# Patient Record
Sex: Male | Born: 1950 | Marital: Married | Smoking: Never smoker
Health system: Southern US, Community
[De-identification: ages and names within clinical notes are randomized; demographics above are authoritative.]

## PROBLEM LIST (undated history)

## (undated) DIAGNOSIS — F32A Depression, unspecified: Secondary | ICD-10-CM

## (undated) DIAGNOSIS — F329 Major depressive disorder, single episode, unspecified: Secondary | ICD-10-CM

## (undated) DIAGNOSIS — I219 Acute myocardial infarction, unspecified: Secondary | ICD-10-CM

## (undated) DIAGNOSIS — I2510 Atherosclerotic heart disease of native coronary artery without angina pectoris: Secondary | ICD-10-CM

## (undated) DIAGNOSIS — I471 Supraventricular tachycardia: Secondary | ICD-10-CM

## (undated) DIAGNOSIS — R001 Bradycardia, unspecified: Secondary | ICD-10-CM

## (undated) DIAGNOSIS — I499 Cardiac arrhythmia, unspecified: Secondary | ICD-10-CM

## (undated) DIAGNOSIS — E785 Hyperlipidemia, unspecified: Secondary | ICD-10-CM

## (undated) DIAGNOSIS — F419 Anxiety disorder, unspecified: Secondary | ICD-10-CM

## (undated) DIAGNOSIS — I2583 Coronary atherosclerosis due to lipid rich plaque: Principal | ICD-10-CM

## (undated) HISTORY — PX: CORONARY ANGIOPLASTY: SHX604

## (undated) HISTORY — PX: OTHER SURGICAL HISTORY: SHX169

## (undated) HISTORY — DX: Bradycardia, unspecified: R00.1

## (undated) HISTORY — DX: Hyperlipidemia, unspecified: E78.5

## (undated) HISTORY — DX: Supraventricular tachycardia: I47.1

## (undated) HISTORY — DX: Coronary atherosclerosis due to lipid rich plaque: I25.83

## (undated) HISTORY — DX: Atherosclerotic heart disease of native coronary artery without angina pectoris: I25.10

---

## 2005-05-05 ENCOUNTER — Encounter (INDEPENDENT_AMBULATORY_CARE_PROVIDER_SITE_OTHER): Payer: Self-pay | Admitting: Specialist

## 2005-05-05 ENCOUNTER — Ambulatory Visit (HOSPITAL_COMMUNITY): Admission: RE | Admit: 2005-05-05 | Discharge: 2005-05-05 | Payer: Self-pay | Admitting: Gastroenterology

## 2007-08-02 ENCOUNTER — Inpatient Hospital Stay (HOSPITAL_COMMUNITY): Admission: EM | Admit: 2007-08-02 | Discharge: 2007-08-05 | Payer: Self-pay | Admitting: Emergency Medicine

## 2007-08-08 ENCOUNTER — Encounter: Admission: RE | Admit: 2007-08-08 | Discharge: 2007-08-08 | Payer: Self-pay | Admitting: Cardiology

## 2008-12-06 IMAGING — CT CT PELVIS W/ CM
2 of 3 series · 17 of 46 positions shown, 19 images · IV contrast ([ID] OMNI 300)
Comparison: None.

CLINICAL DATA: 56 year old male status post cardiac catheterization with pelvic pain and some ecchymoses.   Evaluate for retroperitoneal hematoma or femoral artery pseudoaneurysm.
PELVIS CT WITH CONTRAST:
TECHNIQUE: Multidetector CT imaging of the pelvis was performed following the standard protocol during bolus administration of intravenous contrast.
Contrast: 125 ml Omnipaque 300

[Series 3: routine pelvis · axial · 0.74mm/px · z∈[-202,+3]mm · 14 of 49 slices shown, 16 images]
[im 4/49  soft-tissue]
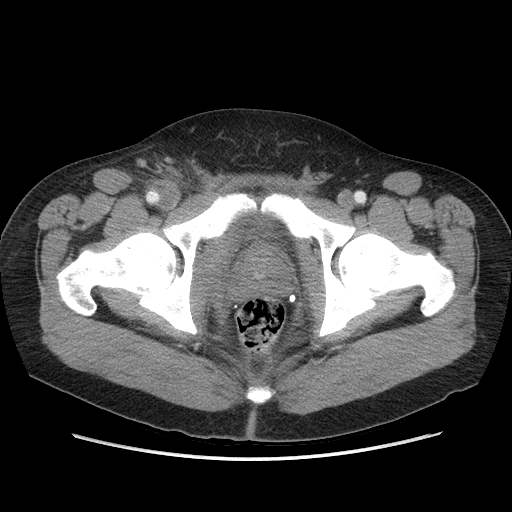
[im 4/49  bone]
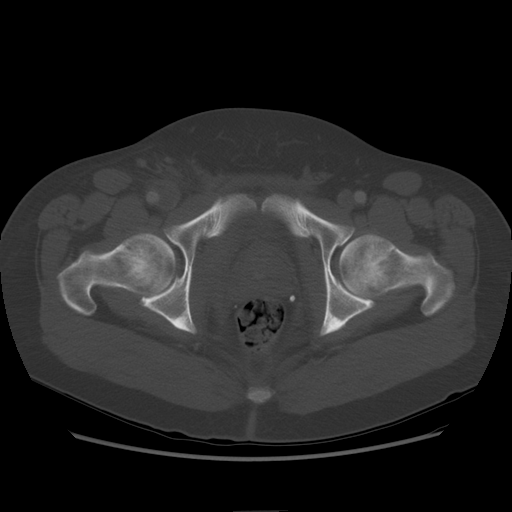
[im 7/49  soft-tissue]
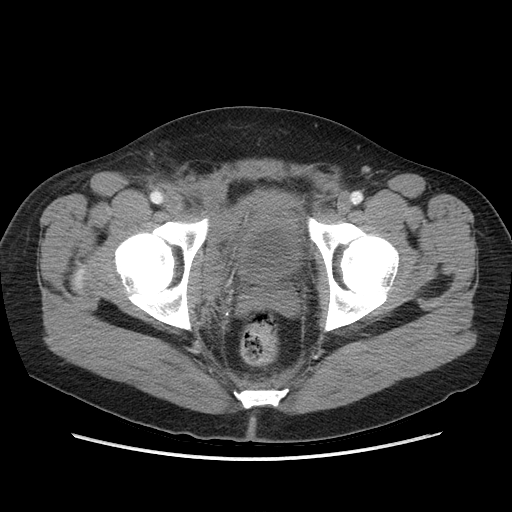
[im 10/49  soft-tissue]
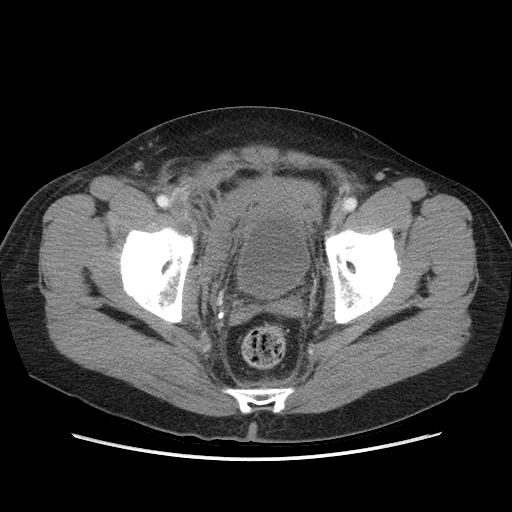
[im 13/49  soft-tissue]
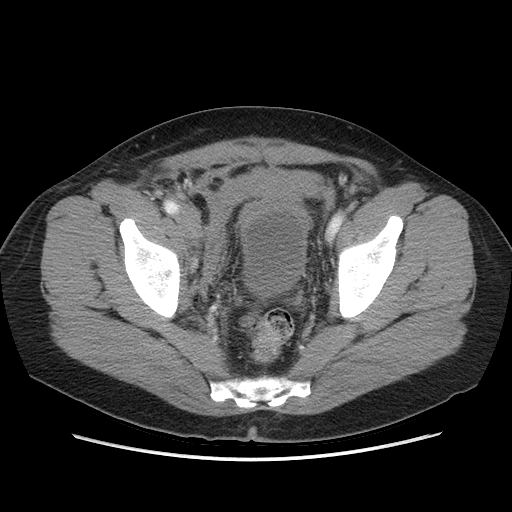
[im 16/49  soft-tissue]
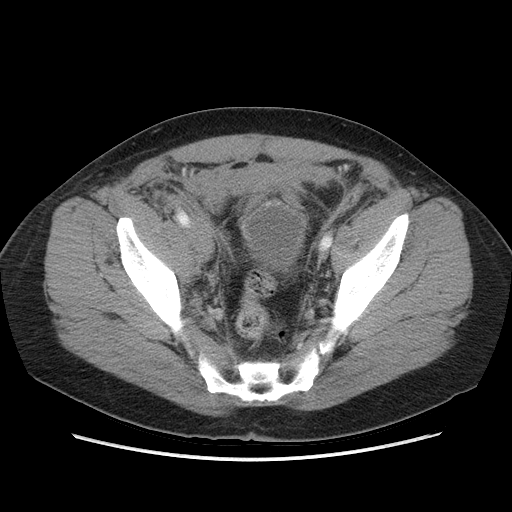
[im 19/49  soft-tissue]
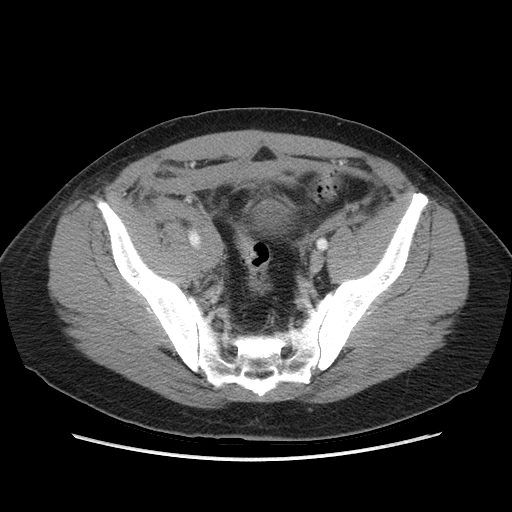
[im 22/49  soft-tissue]
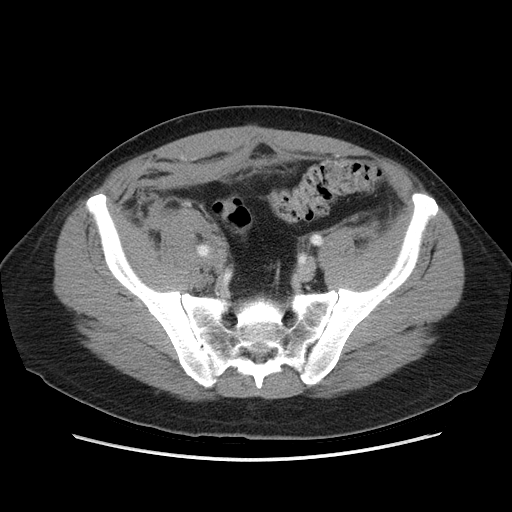
[im 27/49  soft-tissue]
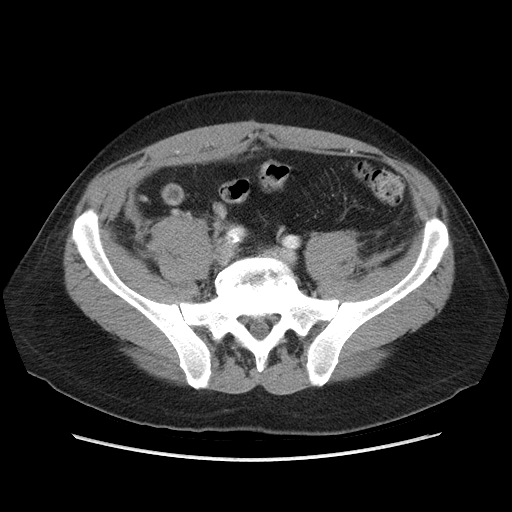
[im 30/49  soft-tissue]
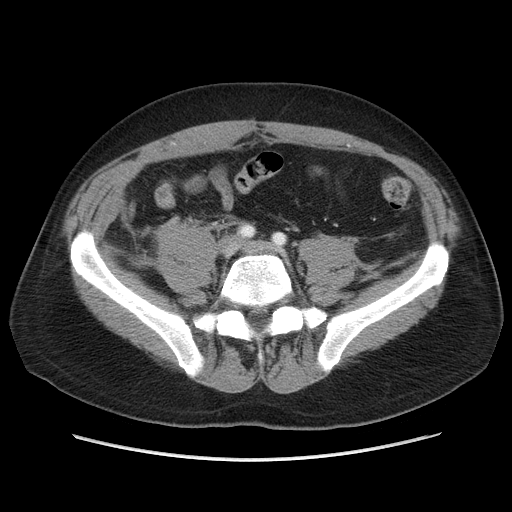
[im 30/49  bone]
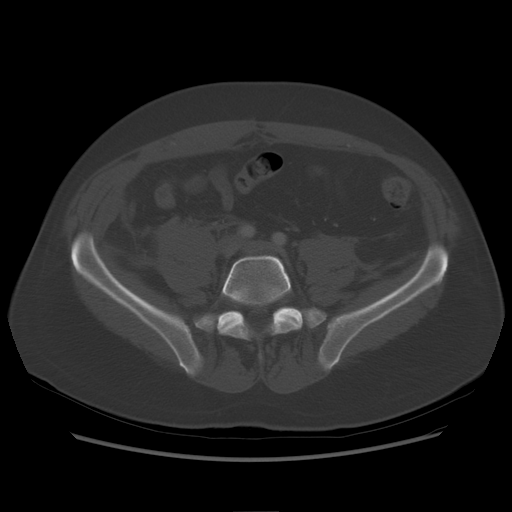
[im 33/49  soft-tissue]
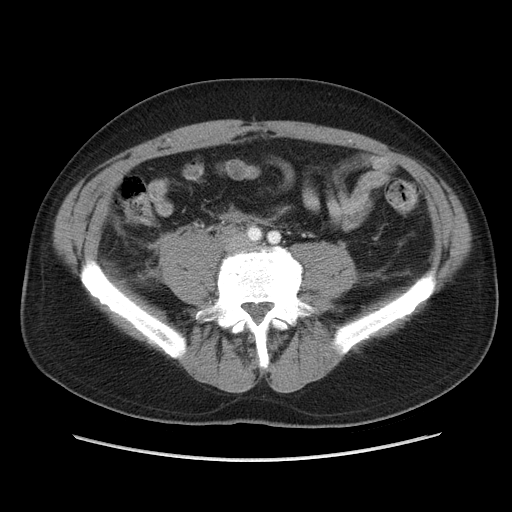
[im 36/49  soft-tissue]
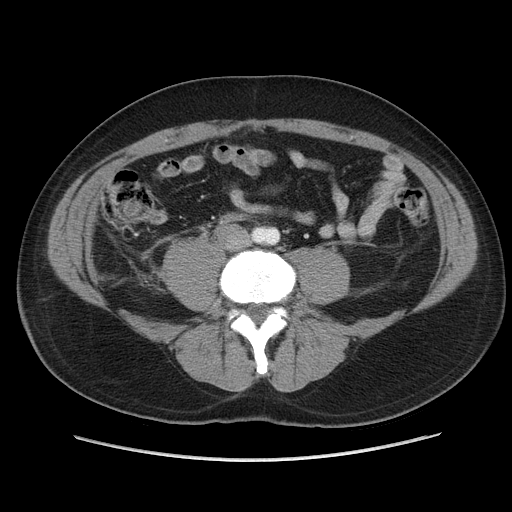
[im 39/49  soft-tissue]
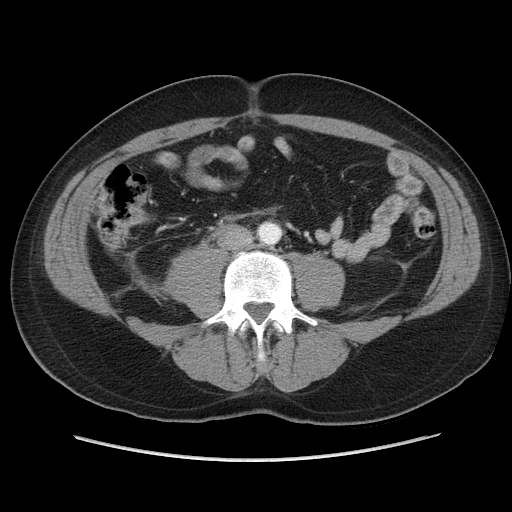
[im 42/49  soft-tissue]
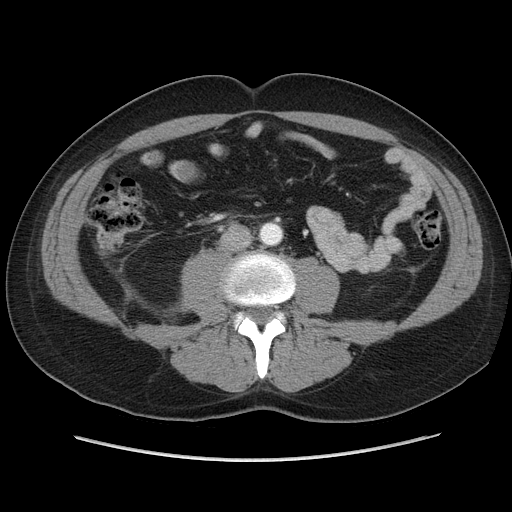
[im 45/49  soft-tissue]
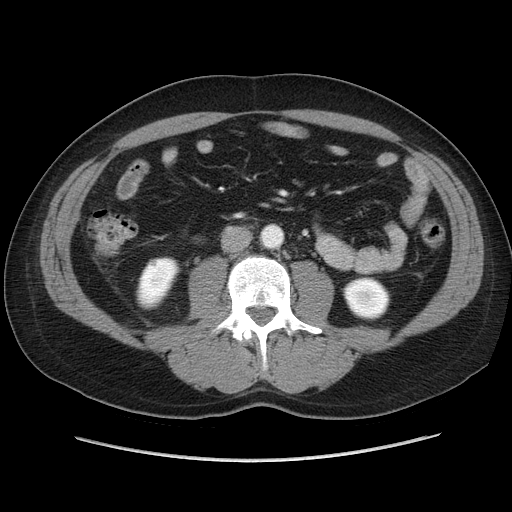

[Series 602: sagittal body · sagittal · 0.74mm/px · 3 of 153 slices shown]
[im 51/153  soft-tissue]
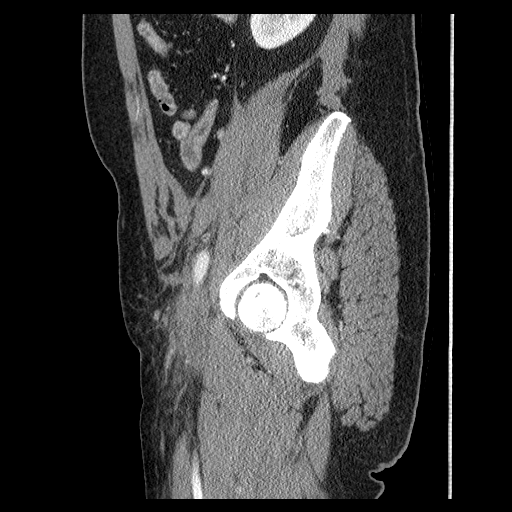
[im 68/153  soft-tissue]
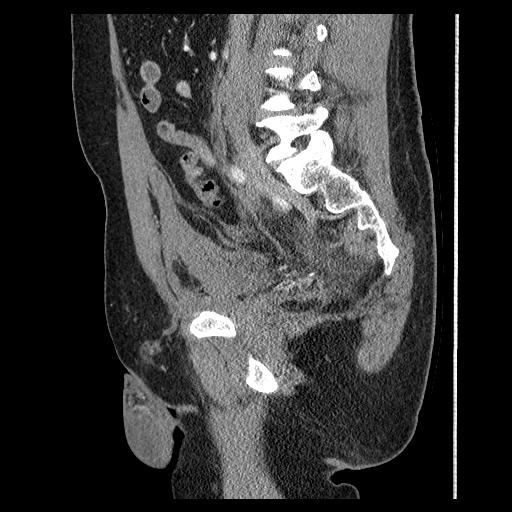
[im 85/153  soft-tissue]
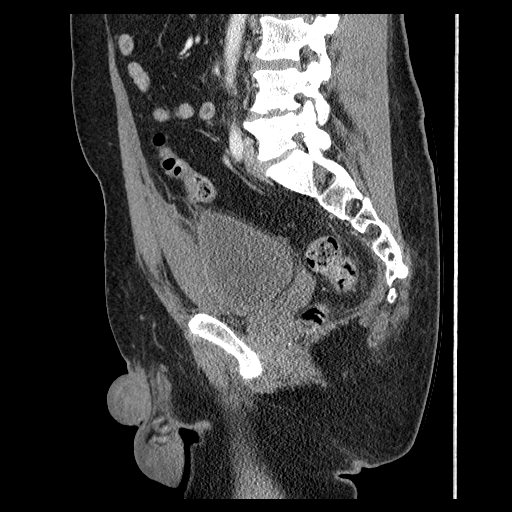

[17 of 46 positions shown; findings below may reference images not displayed]

FINDINGS: No acute osseous abnormality.  Mild disk degeneration in the lower lumbar spine.  The visualized lower poles of the kidneys, large and small bowel loops are within normal limits.  There is no free intraperitoneal fluid identified.  
  There is a moderate amount of extraperitoneal pelvic hematoma and right retroperitoneal hematoma, the latter tracking along the right iliac vessels and right anterior psoas muscle to about the level of the aortoiliac bifurcation.  The extraperitoneal pelvic hemorrhage is right greater than left, but does occupy the midline also with mild associated mass effect on the bladder.  This measures up to 2.6 cm in thickness along the anterior wall of the bladder.  Scanning was continued down into the thighs beyond the level of the superficial and profunda femoral bifurcations.  There is symmetric and normal enhancement of the bilateral iliac vessels and visualized common, superficial, and profunda femoral arteries.  There is mild intermittent atherosclerotic plaque.  At the right common femoral artery, there is no pseudoaneurysm identified and no strong evidence of arteriovenous fistula (no early enhancing veins seen).
IMPRESSION: 1.  Moderate amount of pelvic retroperitoneal and extraperitoneal hematoma as detailed above. 
2.  No evidence of vascular pseudoaneurysm.  No strong evidence of right common femoral arteriovenous fistula.  
The above was discussed with Dr. Susie Weinberg at [DATE] hours on 08/08/07.

## 2010-09-07 ENCOUNTER — Other Ambulatory Visit: Payer: Self-pay | Admitting: Gastroenterology

## 2010-10-20 NOTE — Cardiovascular Report (Signed)
NAME:  Shawn Walton, Shawn Walton NO.:  [id number]   MEDICAL RECORD NO.:  [id number]          PATIENT TYPE:  INP   LOCATION:  2807                         FACILITY:  [facility]   PHYSICIAN:  Jake Bathe, MD      DATE OF BIRTH:  07/05/1950   DATE OF PROCEDURE:  08/03/2007  DATE OF DISCHARGE:                            CARDIAC CATHETERIZATION   INDICATIONS:  This is a 60 year old male with chest pain beginning last  night at 10 p.m., burning with ECG electrocardiographically silent.  Troponin value this afternoon was drawn and was found to be 13 with an  MB of 80.  Likely occlusion or thrombus.   PROCEDURES:  1. Left heart catheterization.  2. Left ventriculogram.  3. Selective coronary angiography.   PROCEDURE DETAILS:  Informed consent was obtained.  Risks and benefits  including stroke, heart attack, and death were explained to the patient  and his wife at length.  He was placed on the catheterization table,  prepped in a sterile fashion.  After fluoroscopic visualization of the  femoral head, a 6-French sheath was placed using the modified Seldinger  technique in the right groin.  Judkins left #4 catheter was placed  selectively in the left main artery and multiple views with hand  injection of Omnipaque were obtained.  This catheter was exchanged for a  guide 6-French right and R4 Judkins right guide, selectively cannulated  the right coronary artery.  One view with hand injection was obtained  showing occlusion.  A left ventriculogram in the RAO position with power  injection was obtained following the percutaneous intervention.  Following procedure, sheath was removed and closure device was placed.  Prior to intervention, a 6-French venous sheath was placed in the right  femoral artery using the modified Seldinger technique.  Manual  compression held; sheath removed.   FINDINGS:  1. Left main artery - short, branches into the circumflex and LAD.  No      disease.  2. Left anterior descending artery - branches into 3 diagonal branches      which are small to moderate size in caliber.  There were minor      irregularities throughout the LAD.  This continues to wrap around      the apex.  3. Left circumflex artery - large caliber proximal vessel with 2      obtuse marginal branches which are large.  There are minor      irregularities throughout.  4. Right coronary artery - there is a 100% occlusion in the mid right      coronary artery with thrombus.  There are left to right collaterals      filling the distal vessel (poor to moderate).  5. Left ventriculogram - there is basal to mid left ventricular      akinesis with ejection fraction preserved at 55%.  6. Hemodynamics - left ventricular systolic pressure was 125 and left      ventricular end-diastolic pressure was 20 mmHg.  Aortic pressure      was 125/69 with a mean of 89 mmHg.   IMPRESSION:  1. Occluded  right coronary artery with thrombus.  Otherwise, minor      irregularities throughout coronary tree.  2. Inferior basal to mid akinesis of left ventricle with preserved      ejection fraction of 55% with mildly elevated left ventricular end-      diastolic pressure of 20 mmHg.   RECOMMENDATIONS:  Findings were discussed with Dr. Eldridge Dace and  percutaneous intervention with drug-eluting stent to the right coronary  artery was performed with thrombectomy performed prior to this.  Heparin  and Integrilin were utilized for anticoagulation.  Closure device was  placed, Angio-Seal.  The patient tolerated the  procedure well with some mild bradycardia throughout in the upper 40s,  but currently his rate is 62.  Blood pressure currently is 132/84.  He  will be placed in a CCU bed.  Perhaps electrocardiographically silent  ECG is secondary to left to right collaterals.      Jake Bathe, MD  Electronically Signed     MCS/MEDQ  D:  08/02/2007  T:  08/03/2007  Job:  [id number]   cc:   Thora Lance, M.D.  Theressa Millard, M.D.

## 2010-10-20 NOTE — Discharge Summary (Signed)
NAME:  Shawn Walton, REWERTS NO.:  [id number]   MEDICAL RECORD NO.:  [id number]          PATIENT TYPE:  INP   LOCATION:  2020                         FACILITY:  [facility]   PHYSICIAN:  Jake Bathe, MD      DATE OF BIRTH:  01-26-1951   DATE OF ADMISSION:  08/02/2007  DATE OF DISCHARGE:  08/05/2007                               DISCHARGE SUMMARY   DISCHARGE DIAGNOSES:  1. Non-ST-segment elevated myocardial infarction, status post      percutaneous intervention utilizing a drug-eluting stent to the      right coronary artery.  2. Hypertension.  3. Supraventricular tachycardia.  4. Dyslipidemia  5. Long-term medication use.   Mr. Weathington is a 60 year old male patient who began having chest  burning the night before admission, for which he took aspirin and  Tums.  He thought it felt like his reflux.  He went to see Dr. Valentina Lucks and was  evaluated with an EKG that was unremarkable.  Labs were drawn, and  troponin was 13.2.  He was sent to the emergency room.   He was then taken to the cardiac catheterization lab for a non-ST-  segment elevated myocardial infarction.  He was found to have an  occluded right coronary artery with thrombus.  A drug-eluting stent was  placed to that area by Dr. Everette Rank.  The patient remained in the  hospital over the next several days without any complications.  His  activity was increased through the efforts of cardiac rehabilitation.   Lab studies during his hospital stay included a hemoglobin of 10.5,  hematocrit 30.2, platelets 155, BUN 11, creatinine 1.08, TSH 1.305.   By August 05, 2007, the patient was felt to be ready for discharge to  home.   DISCHARGE MEDICATIONS:  1. Aspirin 325 mg 1 p.o. daily.  2. Plavix 75 mg daily.  3. Lopressor 12.5 mg p.o. b.i.d.  4. Lisinopril 5 mg a day.  5. Lipitor 40 mg q.h.s.  6. Iron 325 mg twice a day.  7. Sublingual nitroglycerin p.r.n.   Remain on a low-sodium heart-healthy diet.  No  lifting over 10 pounds  for 4 days.  Continue activity as per cardiac rehabilitation.  Follow up  with Dr. Donato Schultz on Friday, March 13, at 1:00 p.m.      Guy Franco, P.A.      Jake Bathe, MD  Electronically Signed    LB/MEDQ  D:  08/05/2007  T:  08/06/2007  Job:  [id number]   cc:   Thora Lance, M.D.  Jake Bathe, MD

## 2010-10-20 NOTE — H&P (Signed)
NAME:  Shawn Walton, Shawn Walton NO.:  [id number]   MEDICAL RECORD NO.:  [id number]          PATIENT TYPE:  INP   LOCATION:  2907                         FACILITY:  [facility]   PHYSICIAN:  Jake Bathe, MD      DATE OF BIRTH:  26-Jan-1951   DATE OF ADMISSION:  08/02/2007  DATE OF DISCHARGE:                              HISTORY & PHYSICAL   PRIMARY CARE PHYSICIAN:  Theressa Millard, M.D.   Physician seeing in office today, Thora Lance, M.D.   CHIEF COMPLAINT:  Chest pain/myocardial infarction.   HISTORY OF PRESENT ILLNESS:  A 60 year old male with hypertension,  supraventricular tachycardia who at 10 p.m. last night began having  chest pain after a long tennis match and drinking a few beers with his  friends.  He was driving home, had a burning sensation substernum, had  some diaphoresis with this.  No significant shortness of breath or  radiation.  Took aspirin and some Tums last night, pain subsided, went  to sleep.  Then woke up this morning felt uneasy, went to Dr. Amedeo Kinsman office where an EKG was performed and was unremarkable.  He  was scheduled to have a stress test tomorrow, however, Dr. Valentina Lucks  performed a set of cardiac biomarkers on him and they were markedly  positive and he immediately told him to come to the emergency department  for further evaluation and paged me in the process.   Once in the emergency department, he was still complaining of some mild  burning substernally with some left elbow pain, however, looked quite  comfortable and he was here with his wife.  Denied any orthopnea, PND,  edema, or bleeding problems.   PAST MEDICAL HISTORY:  1. Hypertension.  2. SVT - takes atenolol.  3. GERD.  4. Anxiety.   ALLERGIES:  ERYTHROMYCIN, stomach upset.   PAST SURGICAL HISTORY:  No surgical history.   MEDICATIONS:  1. Lisinopril 10 mg once a day.  2. Atenolol 15 mg once a day.  3. Ibuprofen p.r.n.  4. Alprazolam p.r.n.   SOCIAL HISTORY:   No tobacco, moderate to high alcohol intake.  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  He is married.   FAMILY HISTORY:  Father died of a myocardial infarction.  He had his  first heart issues in his 40s, had bypass, stroke.   REVIEW OF SYSTEMS:  Unless explained above all the other 12 review of  systems negative.   PHYSICAL EXAMINATION:  VITAL SIGNS:  Blood pressure 132/84, pulse 60 and  regular, afebrile, and respiration rate 18.  GENERAL:  Alert and oriented x3 in no acute distress.  Mildly pale  appearance, resting comfortably, here with his wife.  EYES: Well perfused conjunctivae.  EOMI.  No scleral icterus.  CARDIOVASCULAR:  Regular rate and rhythm.  No murmurs, rubs, or gallops.  Normal PMI.  LUNGS:  Clear to auscultation bilaterally.  Normal respiratory effort.  ABDOMEN:  Soft and nontender.  Normoactive bowel sounds.  No bruits.  EXTREMITIES:  No clubbing, cyanosis, or edema, 2+ femoral pulses  bilaterally.  NEUROLOGIC:  Nonfocal.  Normal gait.  No tremor.  SKIN:  Warm, dry, and intact.  No rashes.   DATA:  ECG obtained in the emergency department shows sinus bradycardia,  rate 55 with normal intervals.  Very small Q-waves in III and AVF which  are certainly nonpathologic.  Nonspecific ST-T wave changes.  Possible  left ventricular hypertrophy.  Sinus arrhythmia noted.  When compared to  prior ECG done in Dr. Jone Baseman office, there is no significant change.   LABS:  Creatinine was 1.1, CK 1000, troponin was 13, MB was 80.  The  chest x-ray is currently pending.  Prior clinic note reviewed.   ASSESSMENT/PLAN:  A 60 year old male with hypertension, history of SVT  with highly elevated troponin level, possible occlusion, categorized as  a non-ST-elevation myocardial infarction.   PLAN:  1. Take emergently to cath lab, discussed case with Dr. Eldridge Dace who      will meet me there.  Give aspirin, oxygen, beta blocker as      tolerated.  We will place  on heparin, Integrilin, and give Plavix      once in cath lab.  2. Hypertension - continue to monitor on lisinopril.  3. SVT - no current issue.  4. GERD - may require proton pump inhibitor.      Jake Bathe, MD  Electronically Signed     MCS/MEDQ  D:  08/02/2007  T:  08/03/2007  Job:  [id number]   cc:   Thora Lance, M.D.  Theressa Millard, M.D.

## 2010-10-20 NOTE — Cardiovascular Report (Signed)
NAME:  MORSE, BRUEGGEMANN NO.:  [id number]   MEDICAL RECORD NO.:  [id number]          PATIENT TYPE:  INP   LOCATION:  2807                         FACILITY:  [facility]   PHYSICIAN:  Corky Crafts, MDDATE OF BIRTH:  [birth year]   DATE OF PROCEDURE:  08/02/2007  DATE OF DISCHARGE:                            CARDIAC CATHETERIZATION   REFERRING:  Dr. Benjaman Kindler and Dr. Kirby Funk.   PROCEDURE PERFORMED:  Percutaneous coronary intervention of the right  coronary artery, left heart catheterization, left ventriculogram.   OPERATOR:  Dr. Eldridge Dace.   INDICATIONS:  Non-ST-segment elevation MI.   PROCEDURE:  The diagnostic catheterization was performed by Dr. Anne Fu  after informed consent was obtained.  An occluded mid right coronary  artery was found, and the intervention was then performed.  See below  for details.   FINDINGS:  Occluded mid RCA with left-to-right collaterals.   PERCUTANEOUS CORONARY INTERVENTION NARRATIVE:  A JR-4 guiding catheter  was used to engage the ostium of the right coronary artery.  A Prowater  wire was placed across the lesion.  Heparin and Integrilin were used for  anticoagulation.  A Fetch catheter was then used, and successful  aspiration of thrombus was performed.  There was residual of 99% mid  right lesion after the Fetch catheter was removed.  A 2.5 x 9-mm  Maverick balloon was then placed across the lesion and inflated to 8  atmospheres for 24 seconds.  A 2.75 x 12-mm Promus stent was then placed  across the lesion and deployed at 12 atmospheres for 40 seconds.  The  midportion of the stent was postdilated with a 3.0 x 8-mm Maverick  balloon to 18 atmospheres for 23 seconds.  There is an excellent  angiographic result with no residual stenosis.  TIMI flow went from 0 to  3.  The initial 100% stenosis was down to 0 at the end of the case.  The  left ventriculogram showed mild hypokinesis of the inferobasal segment,  left  ventricular pressure 125/7 with an LVEDP of 20 mmHg, aortic  pressure of 130/75 with a mean aortic pressure of 99 mmHg.   IMPRESSION:  1. Acute inferior myocardial infarction, successful percutaneous      coronary intervention of the right coronary artery with a 2.75 x 12-      mm Promus stent postdilated to greater than 3 mm in diameter.  2. Overall preserved left ventricular ejection fraction about 55-60%,      small area of inferobasal high hypokinesis.  3. Mildly increased left ventricular end-diastolic pressure.   RECOMMENDATIONS:  The patient will be watched in the CCU.  Will continue  Integrilin for 18 hours.  Continue aspirin and Plavix as well along with  other secondary prevention.      Corky Crafts, MD  Electronically Signed     JSV/MEDQ  D:  08/02/2007  T:  08/03/2007  Job:  [id number]

## 2010-10-23 NOTE — Op Note (Signed)
NAME:  CARRY, ORTEZ NO.:  [id number]   MEDICAL RECORD NO.:  [id number]          PATIENT TYPE:  AMB   LOCATION:  ENDO                         [facility]   PHYSICIAN:  Danise Edge, M.D.   DATE OF BIRTH:  1951-04-08   DATE OF PROCEDURE:  05/05/2005  DATE OF DISCHARGE:                                 OPERATIVE REPORT   PROCEDURE:  Colonoscopy and polypectomy.   INDICATIONS FOR PROCEDURE:  Shawn Walton is a 60 year old male born  Feb 06, 1951. Shawn Walton is scheduled to undergo his first screening  colonoscopy with polypectomy to prevent colon cancer.   ENDOSCOPIST:  Danise Edge, M.D.   PREMEDICATION:  Demerol 50 mg, Versed 7.5 mg.   DESCRIPTION OF PROCEDURE:  After obtaining informed consent, Shawn Walton  was placed in the left lateral decubitus position. I administered  intravenous Demerol and intravenous Versed to achieve conscious sedation for  the procedure. The patient's blood pressure, oxygen saturation and cardiac  rhythm were monitored throughout the procedure and documented in the medical  record.   Anal inspection was normal. Digital rectal exam reveals a non-nodular  prostate. The Olympus adjustable pediatric colonoscope was introduced into  the rectum and easily advanced to the cecum. A normal appearing appendiceal  orifice and ileocecal valve were identified. Colonic preparation for the  exam today was excellent.   RECTUM:  A 1 mm sessile polyp was removed from the distal rectum with the  cold biopsy forceps. Retroflexed view of the very distal rectum was  otherwise normal.  SIGMOID COLON AND DESCENDING COLON:  At approximately 20 cm from the anal  verge, two 2 mm sessile polyps were removed with the electrocautery snare.  SPLENIC FLEXURE:  Normal.  TRANSVERSE COLON:  From the proximal transverse colon, a 3 mm sessile polyp  was removed with the electrocautery snare.  HEPATIC FLEXURE:  Normal.  ASCENDING COLON:   Normal.  CECUM AND ILEOCECAL VALVE:  Normal.   ASSESSMENT:  A 3 mm polyp was removed from the proximal transverse colon,  two 2 mm polyps were removed from the distal sigmoid colon at 20 cm, a  diminutive polyp was removed from the distal rectum.           ______________________________  Danise Edge, M.D.     MJ/MEDQ  D:  05/05/2005  T:  05/05/2005  Job:  [id number]   cc:   Theressa Millard, M.D.  Fax: [phone]

## 2011-02-26 LAB — CBC
HCT: 29.4 — ABNORMAL LOW
HCT: 30.2 — ABNORMAL LOW
HCT: 32.5 — ABNORMAL LOW
Hemoglobin: 11.3 — ABNORMAL LOW
MCHC: 33.9
MCHC: 34.3
MCV: 91.5
MCV: 92.2
MCV: 92.2
MCV: 93
Platelets: 201
Platelets: 216
RBC: 3.16 — ABNORMAL LOW
RBC: 3.27 — ABNORMAL LOW
RBC: 3.55 — ABNORMAL LOW
RDW: 12.9
WBC: 10.8 — ABNORMAL HIGH
WBC: 11.8 — ABNORMAL HIGH
WBC: 12.1 — ABNORMAL HIGH

## 2011-02-26 LAB — BASIC METABOLIC PANEL
BUN: 10
BUN: 11
CO2: 28
CO2: 32
Chloride: 104
Chloride: 105
Chloride: 106
Creatinine, Ser: 1.13
Creatinine, Ser: 1.17
GFR calc Af Amer: >60
GFR calc Af Amer: >60
GFR calc non Af Amer: >60
Potassium: 3.8
Potassium: 4.3

## 2011-02-26 LAB — TSH: TSH: 1.427

## 2011-02-26 LAB — LIPID PANEL
HDL: 33 — ABNORMAL LOW
Total CHOL/HDL Ratio: 4.7
Triglycerides: 93
VLDL: 19

## 2011-02-26 LAB — COMPREHENSIVE METABOLIC PANEL
ALT: 36
CO2: 30
Calcium: 9.9
Creatinine, Ser: 1.12
GFR calc non Af Amer: >60
Glucose, Bld: 113 — ABNORMAL HIGH

## 2011-02-26 LAB — DIFFERENTIAL
Eosinophils Absolute: 0.1
Lymphs Abs: 2.4
Neutrophils Relative %: 73

## 2011-02-26 LAB — CARDIAC PANEL(CRET KIN+CKTOT+MB+TROPI): Troponin I: 30.78

## 2011-02-26 LAB — CK TOTAL AND CKMB (NOT AT ARMC): Total CK: 1076 — ABNORMAL HIGH

## 2011-02-26 LAB — PROTIME-INR
INR: 1.1
Prothrombin Time: 13.9

## 2012-01-19 ENCOUNTER — Encounter (HOSPITAL_BASED_OUTPATIENT_CLINIC_OR_DEPARTMENT_OTHER): Payer: Self-pay | Admitting: *Deleted

## 2012-01-19 ENCOUNTER — Observation Stay (HOSPITAL_BASED_OUTPATIENT_CLINIC_OR_DEPARTMENT_OTHER)
Admission: EM | Admit: 2012-01-19 | Discharge: 2012-01-19 | Disposition: A | Discharge status: Home / Self Care | Payer: BC Managed Care – PPO | Attending: Interventional Cardiology | Admitting: Interventional Cardiology

## 2012-01-19 ENCOUNTER — Other Ambulatory Visit: Payer: Self-pay

## 2012-01-19 ENCOUNTER — Emergency Department (HOSPITAL_BASED_OUTPATIENT_CLINIC_OR_DEPARTMENT_OTHER): Payer: BC Managed Care – PPO

## 2012-01-19 DIAGNOSIS — I2510 Atherosclerotic heart disease of native coronary artery without angina pectoris: Secondary | ICD-10-CM | POA: Insufficient documentation

## 2012-01-19 DIAGNOSIS — R0789 Other chest pain: Principal | ICD-10-CM | POA: Insufficient documentation

## 2012-01-19 DIAGNOSIS — I252 Old myocardial infarction: Secondary | ICD-10-CM | POA: Insufficient documentation

## 2012-01-19 DIAGNOSIS — R61 Generalized hyperhidrosis: Secondary | ICD-10-CM | POA: Insufficient documentation

## 2012-01-19 DIAGNOSIS — R1013 Epigastric pain: Secondary | ICD-10-CM | POA: Insufficient documentation

## 2012-01-19 DIAGNOSIS — R110 Nausea: Secondary | ICD-10-CM | POA: Insufficient documentation

## 2012-01-19 DIAGNOSIS — Z9861 Coronary angioplasty status: Secondary | ICD-10-CM | POA: Insufficient documentation

## 2012-01-19 DIAGNOSIS — I249 Acute ischemic heart disease, unspecified: Secondary | ICD-10-CM

## 2012-01-19 DIAGNOSIS — E785 Hyperlipidemia, unspecified: Secondary | ICD-10-CM | POA: Insufficient documentation

## 2012-01-19 HISTORY — DX: Acute myocardial infarction, unspecified: I21.9

## 2012-01-19 LAB — COMPREHENSIVE METABOLIC PANEL
ALT: 27 U/L (ref 0–53)
Albumin: 4.3 g/dL (ref 3.5–5.2)
Alkaline Phosphatase: 57 U/L (ref 39–117)
GFR calc Af Amer: >90 mL/min (ref >90)
Glucose, Bld: 110 mg/dL — ABNORMAL HIGH (ref 70–99)
Potassium: 3.8 mEq/L (ref 3.5–5.1)
Sodium: 142 mEq/L (ref 135–145)
Total Protein: 7.7 g/dL (ref 6.0–8.3)

## 2012-01-19 LAB — CBC WITH DIFFERENTIAL/PLATELET
Eosinophils Absolute: 0.3 10*3/uL (ref 0.0–0.7)
Lymphs Abs: 3.6 10*3/uL (ref 0.7–4.0)
MCH: 31.5 pg (ref 26.0–34.0)
Neutrophils Relative %: 39 % — ABNORMAL LOW (ref 43–77)
Platelets: 185 10*3/uL (ref 150–400)
RBC: 4.83 MIL/uL (ref 4.22–5.81)
WBC: 8.2 10*3/uL (ref 4.0–10.5)

## 2012-01-19 LAB — CARDIAC PANEL(CRET KIN+CKTOT+MB+TROPI)
CK, MB: 2.2 ng/mL (ref 0.3–4.0)
Relative Index: 2.1 (ref 0.0–2.5)
Relative Index: 2.1 (ref 0.0–2.5)
Troponin I: <0.3 ng/mL (ref <0.30)
Troponin I: <0.3 ng/mL (ref <0.30)

## 2012-01-19 LAB — HEPARIN LEVEL (UNFRACTIONATED): Heparin Unfractionated: 1.17 IU/mL — ABNORMAL HIGH (ref 0.30–0.70)

## 2012-01-19 LAB — URINALYSIS, ROUTINE W REFLEX MICROSCOPIC
Leukocytes, UA: NEGATIVE
Nitrite: NEGATIVE
Specific Gravity, Urine: 1.014 (ref 1.005–1.030)
pH: 8 (ref 5.0–8.0)

## 2012-01-19 LAB — PROTIME-INR: INR: 1.05 (ref 0.00–1.49)

## 2012-01-19 LAB — MRSA PCR SCREENING: MRSA by PCR: NEGATIVE

## 2012-01-19 MED ORDER — CARVEDILOL 12.5 MG PO TABS
12.5000 mg | ORAL_TABLET | Freq: Every day | ORAL | Status: DC
  Administered 2012-01-19: 12.5 mg via ORAL
  Filled 2012-01-19 (×2): qty 1

## 2012-01-19 MED ORDER — NITROGLYCERIN 0.4 MG SL SUBL: 0.4000 mg | SUBLINGUAL_TABLET | SUBLINGUAL | Status: AC | PRN

## 2012-01-19 MED ORDER — HEPARIN (PORCINE) IN NACL 100-0.45 UNIT/ML-% IJ SOLN
1350.0000 [IU]/h | INTRAMUSCULAR | Status: DC
  Administered 2012-01-19: 1350 [IU]/h via INTRAVENOUS
  Filled 2012-01-19: qty 250

## 2012-01-19 MED ORDER — ONDANSETRON HCL 4 MG/2ML IJ SOLN: 4.0000 mg | Freq: Four times a day (QID) | INTRAMUSCULAR | Status: DC | PRN

## 2012-01-19 MED ORDER — ACETAMINOPHEN 325 MG PO TABS: 650.0000 mg | ORAL_TABLET | ORAL | Status: DC | PRN

## 2012-01-19 MED ORDER — ALPRAZOLAM 0.25 MG PO TABS: 0.2500 mg | ORAL_TABLET | Freq: Two times a day (BID) | ORAL | Status: DC | PRN

## 2012-01-19 MED ORDER — ASPIRIN 81 MG PO CHEW
324.0000 mg | CHEWABLE_TABLET | Freq: Once | ORAL | Status: AC
  Administered 2012-01-19: 324 mg via ORAL
  Filled 2012-01-19: qty 4

## 2012-01-19 MED ORDER — ATORVASTATIN CALCIUM 40 MG PO TABS
40.0000 mg | ORAL_TABLET | Freq: Every day | ORAL | Status: DC
  Filled 2012-01-19: qty 1

## 2012-01-19 MED ORDER — HEPARIN BOLUS VIA INFUSION
4000.0000 [IU] | Freq: Once | INTRAVENOUS | Status: AC
  Administered 2012-01-19: 4000 [IU] via INTRAVENOUS

## 2012-01-19 MED ORDER — ASPIRIN EC 81 MG PO TBEC
81.0000 mg | DELAYED_RELEASE_TABLET | Freq: Every day | ORAL | Status: DC
  Administered 2012-01-19: 81 mg via ORAL
  Filled 2012-01-19: qty 1

## 2012-01-19 MED ORDER — HEPARIN (PORCINE) IN NACL 100-0.45 UNIT/ML-% IJ SOLN
16.0000 [IU]/kg/h | Freq: Once | INTRAMUSCULAR | Status: AC
  Administered 2012-01-19: 16 [IU]/kg/h via INTRAVENOUS
  Filled 2012-01-19: qty 250

## 2012-01-19 MED ORDER — CITALOPRAM HYDROBROMIDE 20 MG PO TABS
20.0000 mg | ORAL_TABLET | Freq: Every day | ORAL | Status: DC
  Administered 2012-01-19: 20 mg via ORAL
  Filled 2012-01-19: qty 1

## 2012-01-19 MED ORDER — ASPIRIN 81 MG PO TABS: 81.0000 mg | ORAL_TABLET | Freq: Every day | ORAL | Status: DC

## 2012-01-19 MED ORDER — NITROGLYCERIN 0.4 MG SL SUBL: 0.4000 mg | SUBLINGUAL_TABLET | SUBLINGUAL | Status: DC | PRN

## 2012-01-19 MED ORDER — GI COCKTAIL ~~LOC~~
30.0000 mL | Freq: Once | ORAL | Status: AC
  Administered 2012-01-19: 30 mL via ORAL
  Filled 2012-01-19: qty 30

## 2012-01-19 NOTE — Progress Notes (Signed)
Pt discharged to home with wife. AVS reviewed with pt and wife; pt denied questions at this time.

## 2012-01-19 NOTE — ED Notes (Signed)
MD at bedside. 

## 2012-01-19 NOTE — ED Notes (Signed)
SR/SB on monitor, resps even and unlabored, family at bs.

## 2012-01-19 NOTE — ED Notes (Signed)
Pt return from xray, remains SR/SB on monitor. Pt denies any abd discomfort at this time, denies CP.

## 2012-01-19 NOTE — ED Notes (Signed)
Pt woke from sleep with upper abd pain. Pt then went to use restroom, became diaphoretic and had normal BM. Pt states that pain is better at this time but was concerned because he continues to have cold sweats. Pt denies any CP, jaw pain, or SOB.

## 2012-01-19 NOTE — ED Notes (Signed)
Patient transported to X-ray 

## 2012-01-19 NOTE — ED Notes (Signed)
Pt report given to Shannon, RN with [facility] 

## 2012-01-19 NOTE — ED Notes (Signed)
Pt states that the abd discomfort subsided but he continued to have cold sweats while at home. Denies any fevers or chills prior to this episode. Pt states he had these same sxs when he had his previous MI however pt denies any CP, arm pain, or jaw pain.

## 2012-01-19 NOTE — ED Notes (Signed)
EMT at bs for repeat EKG per MD order

## 2012-01-19 NOTE — ED Provider Notes (Signed)
History     CSN: [id number]  Arrival date & time 01/19/12  0254   First MD Initiated Contact with Patient 01/19/12 [phone]      Chief Complaint  Patient presents with  . Abdominal Pain    (Consider location/radiation/quality/duration/timing/severity/associated sxs/prior treatment) HPI Comments: Patient presents with acute onset of epigastric pain and nausea that will come from sleep around 2 AM. Is associated with diaphoresis. When he woke up with the pain patient went to the bathroom and urinated and had a small bowel movement. The pain improved but he continued to have diaphoresis and nausea. No chest pain, shortness of breath, fever or cough. No lower abdominal pain. Patient with history of MI with stent placement 2009. At the time he had sweats and nausea as well she is what concerned him tonight. He did not have chest pain with his MI.  The history is provided by the patient.    Past Medical History  Diagnosis Date  . MI (myocardial infarction)     Past Surgical History  Procedure Date  . Stent placemnet     History reviewed. No pertinent family history.  History  Substance Use Topics  . Smoking status: Never Smoker   . Smokeless tobacco: Not on file  . Alcohol Use: Yes      Review of Systems  Constitutional: Positive for diaphoresis. Negative for fever and activity change.  HENT: Negative for congestion and rhinorrhea.   Respiratory: Negative for cough and chest tightness.   Cardiovascular: Negative for chest pain.  Gastrointestinal: Positive for nausea and abdominal pain. Negative for vomiting.  Genitourinary: Negative for dysuria and hematuria.  Musculoskeletal: Negative for back pain.  Skin: Negative for rash.  Neurological: Negative for dizziness, light-headedness and headaches.    Allergies  Erythromycin  Home Medications   Current Outpatient Rx  Name Route Sig Dispense Refill  . ASPIRIN 81 MG PO TABS Oral Take 81 mg by mouth daily.    .  ATORVASTATIN CALCIUM 40 MG PO TABS Oral Take 40 mg by mouth daily.    [name] Kitchen CARVEDILOL 12.5 MG PO TABS Oral Take 12.5 mg by mouth 2 (two) times daily with a meal.    . CITALOPRAM HYDROBROMIDE 20 MG PO TABS Oral Take 20 mg by mouth daily.      BP 143/82  Pulse 52  Temp 97.3 F (36.3 C) (Oral)  Resp 14  Ht 6' (1.829 m)  Wt 185 lb (83.915 kg)  BMI 25.09 kg/m2  SpO2 100%  Physical Exam  Constitutional: He is oriented to person, place, and time. He appears well-developed and well-nourished.  HENT:  Head: Normocephalic and atraumatic.  Mouth/Throat: Oropharynx is clear and moist. No oropharyngeal exudate.  Eyes: Conjunctivae are normal. Pupils are equal, round, and reactive to light.  Neck: Normal range of motion. Neck supple.  Cardiovascular: Normal rate, regular rhythm and normal heart sounds.   No murmur heard. Pulmonary/Chest: Effort normal and breath sounds normal. No respiratory distress.  Abdominal: Soft. There is tenderness. There is no rebound and no guarding.       Mild epigastric tenderness  Musculoskeletal: Normal range of motion. He exhibits no edema and no tenderness.  Neurological: He is alert and oriented to person, place, and time. No cranial nerve deficit.  Skin: Skin is warm. He is diaphoretic.    ED Course  Procedures (including critical care time)  Labs Reviewed  CBC WITH DIFFERENTIAL - Abnormal; Notable for the following:    Neutrophils Relative 39 (*)  All other components within normal limits  COMPREHENSIVE METABOLIC PANEL - Abnormal; Notable for the following:    Glucose, Bld 110 (*)     GFR calc non Af Amer 81 (*)     All other components within normal limits  CARDIAC PANEL(CRET KIN+CKTOT+MB+TROPI)  LIPASE, BLOOD  URINALYSIS, ROUTINE W REFLEX MICROSCOPIC   Dg Chest 2 View  01/19/2012  *RADIOLOGY REPORT*  Clinical Data: Epigastric pain.  Chills.  History of cardiac stent.  CHEST - 2 VIEW  Comparison: 08/02/2007.  Findings: Small areas of density are  present at the lung bases bilaterally, most compatible with atelectasis.  No focal consolidation is identified.  No pleural effusion.  Cardiopericardial silhouette and mediastinal contours appear within normal limits. Monitoring leads are projected over the chest.  IMPRESSION: Mild basilar atelectasis.  No acute cardiopulmonary disease.  Original Report Authenticated By: Andreas Newport, M.D.     1. Acute coronary syndrome       MDM  Epigastric pain with nausea and diaphoresis. No Chest pain or SOB.  History concerning given history of MI with similar symptoms.  EKG findings and history d/w Dr. Eldridge Dace on arrival.  [facility] fellow Dr. Wilber Bihari and Dr. Eldridge Dace agree that concave ST changes are likely early repolarization.    Troponin negative. Concern for ACS.  ASA given, heparin bolus and gtt.    Date: 01/19/2012 1610  Rate: 53  Rhythm: sinus bradycardia  QRS Axis: normal  Intervals: normal  ST/T Wave abnormalities: ST elevations anteriorly  Conduction Disutrbances:none  Narrative Interpretation: subtle worsening of ST elevations v2, v3, probably early repolarization  Old EKG Reviewed: changes noted   Date: 01/19/2012 0331  Rate: 52  Rhythm: sinus bradycardia  QRS Axis: normal  Intervals: normal  ST/T Wave abnormalities: ST elevations anteriorly  Conduction Disutrbances:none  Narrative Interpretation:   Old EKG Reviewed: unchanged  CRITICAL CARE Performed by: Glynn Octave   Total critical care time: 30  Critical care time was exclusive of separately billable procedures and treating other patients.  Critical care was necessary to treat or prevent imminent or life-threatening deterioration.  Critical care was time spent personally by me on the following activities: development of treatment plan with patient and/or surrogate as well as nursing, discussions with consultants, evaluation of patient's response to treatment, examination of patient, obtaining history from  patient or surrogate, ordering and performing treatments and interventions, ordering and review of laboratory studies, ordering and review of radiographic studies, pulse oximetry and re-evaluation of patient's condition.   Glynn Octave, MD 01/19/12 [phone]

## 2012-01-19 NOTE — ED Notes (Signed)
Pt c/o feeling of indigestion, MD made aware, new orders rec'd.

## 2012-01-19 NOTE — H&P (Signed)
Admit date: 01/19/2012 [facility]erring [facility] Primary Cardiologist Dr. Anne Fu Chief complaint/reason for admission: Chest discomfort, diaphoresis  HPI: 61 year old man with a prior history of an inferior MI and drug-eluting stent to his right coronary artery.  This all occurred in 2009.  He had a stress test which was normal after the stent was placed.  Early this morning, he woke up with pain in the upper portion of his abdomen.  He began sweating and this lasted for about 15 minutes.  The symptoms were similar to his prior MI so he went to be evaluated.  There was initially some concern about ST elevation on his ECG but this was thought to be due to too early repolarization.  He did not have significant change in his symptoms with nitroglycerin.  The sensation is best described as a burning.  This has waxed and waned spontaneously.  Currently is not having any discomfort.  He has been very active of late.  He has not had any exertional symptoms recently.    PMH:    Past Medical History  Diagnosis Date  . MI (myocardial infarction)     PSH:    Past Surgical History  Procedure Date  . Stent placemnet     ALLERGIES:   Erythromycin  Prior to Admit Meds:   Prescriptions prior to admission  Medication Sig Dispense Refill  . aspirin 81 MG tablet Take 81 mg by mouth daily.      [name] Kitchen atorvastatin (LIPITOR) 40 MG tablet Take 40 mg by mouth daily.      . carvedilol (COREG) 12.5 MG tablet Take 12.5 mg by mouth daily.       . citalopram (CELEXA) 20 MG tablet Take 20 mg by mouth daily.      . Ibuprofen (IBU PO) Take 1 tablet by mouth 2 (two) times daily as needed. For pain       Family HX:   History reviewed. No pertinent family history. Social HX:    History   Social History  . Marital Status: Married    Spouse Name: N/A    Number of Children: N/A  . Years of Education: N/A   Occupational History  . Not on file.   Social History Main Topics  . Smoking status: Never  Smoker   . Smokeless tobacco: Not on file  . Alcohol Use: Yes     1-2 of amy each day   . Drug Use: No  . Sexually Active:    Other Topics Concern  . Not on file   Social History Narrative  . No narrative on file     ROS:  All 11 ROS were addressed and are negative except what is stated in the HPI  PHYSICAL EXAM Filed Vitals:   01/19/12 0900  BP: 127/67  Pulse: 51  Temp:   Resp: 9   General: Well developed, well nourished, in no acute distress Head: Normal cephalic and atramatic  Lungs:   Clear bilaterally to auscultation and percussion. Heart:   HRRR S1 S2 Pulses are 2+ & equal.             Abdomen: Mild epigastric tenderness to palpation.  No rebound, no guarding. Msk:  Back normal,  Normal strength and tone for age. Extremities:   No edema.  Neuro: Alert and oriented X 3. Psych:  Normal affect, responds appropriately   Labs:   Lab Results  Component Value Date   WBC 8.2 01/19/2012   HGB 15.2 01/19/2012  HCT 44.5 01/19/2012   MCV 92.1 01/19/2012   PLT 185 01/19/2012    Lab 01/19/12 0311  NA 142  K 3.8  CL 101  CO2 31  BUN 18  CREATININE 1.00  CALCIUM 9.6  PROT 7.7  BILITOT 0.4  ALKPHOS 57  ALT 27  AST 26  GLUCOSE 110*   Lab Results  Component Value Date   CKTOTAL 163 01/19/2012   CKMB 3.4 01/19/2012   TROPONINI <0.30 01/19/2012   No results found for this basename: PTT   Lab Results  Component Value Date   INR 1.1 08/02/2007     Lab Results  Component Value Date   CHOL  Value: 154        ATP III CLASSIFICATION:  <200     mg/dL   Desirable  161-096  mg/dL   Borderline High  >=045    mg/dL   High 09/13/8117   Lab Results  Component Value Date   HDL 33* 08/03/2007   Lab Results  Component Value Date   LDLCALC  Value: 102        Total Cholesterol/HDL:CHD Risk Coronary Heart Disease Risk Table                     Men   Women  1/2 Average Risk   3.4   3.3* 08/03/2007   Lab Results  Component Value Date   TRIG 93 08/03/2007   Lab Results    Component Value Date   CHOLHDL 4.7 08/03/2007   No results found for this basename: LDLDIRECT      Radiology:  @RISRSLT24 @  EKG:  Normal sinus rhythm, mild early repolarization.  No ischemic ST segment changes.  ASSESSMENT: Coronary artery disease, prior MI.  Possible unstable angina.  PLAN:  The patient has been started on heparin.  Will check serial cardiac enzymes to by her for ischemia.  Will watch the patient on telemetry.  His enzymes turn positive, he'll need coronary angiography.  If he rules out and his symptoms do not return, would consider outpatient nuclear stress test.  He is comfortable with the plan.  Continue aggressive secondary prevention.  His LDL was checked in the office.  Corky Crafts., MD  01/19/2012  11:53 AM

## 2012-01-19 NOTE — Discharge Summary (Signed)
Patient ID: KHYLON DAVIES MRN: [id number] DOB/AGE: 06/28/1950 61 y.o.  Admit date: 01/19/2012 Discharge date: 01/19/2012  Primary Discharge Diagnosis CAD Secondary Discharge Diagnosis chest tightness, prior MI, hyperlipidemia  Significant Diagnostic Studies: none  Consults: None  Hospital Course: 61 y/o who has had prior RCA stent and inferior MI in 2009.  He had some epigastric discomfort, nausea and sweating early this morning that prompted a visit to the ER.  He was sent to [facility] and was ruled out for MI.  He did not have any symptoms after arriving.  Appetite has been normal.  He has moved his bowels.  He feels well and wants to go home.  He is willing to have an outpatient stress test which is planned for nearly next week.  He will return to the hospital if he has any further symptoms.     Discharge Exam: Blood pressure 112/63, pulse 56, temperature 98.9 F (37.2 C), temperature source Oral, resp. rate 14, height 5' 10.5" (1.791 m), weight 85.2 kg (187 lb 13.3 oz), SpO2 97.00%.   [place]/AT RRR S1 S2 CTA bilaterally Soft , nontender, nondistended No edema Labs:   Lab Results  Component Value Date   WBC 8.2 01/19/2012   HGB 15.2 01/19/2012   HCT 44.5 01/19/2012   MCV 92.1 01/19/2012   PLT 185 01/19/2012    Lab 01/19/12 0311  NA 142  K 3.8  CL 101  CO2 31  BUN 18  CREATININE 1.00  CALCIUM 9.6  PROT 7.7  BILITOT 0.4  ALKPHOS 57  ALT 27  AST 26  GLUCOSE 110*   Lab Results  Component Value Date   CKTOTAL 107 01/19/2012   CKMB 2.2 01/19/2012   TROPONINI <0.30 01/19/2012    Lab Results  Component Value Date   CHOL  Value: 154        ATP III CLASSIFICATION:  <200     mg/dL   Desirable  295-621  mg/dL   Borderline High  >=308    mg/dL   High 6/57/8469   Lab Results  Component Value Date   HDL 33* 08/03/2007   Lab Results  Component Value Date   LDLCALC  Value: 102        Total Cholesterol/HDL:CHD Risk Coronary Heart Disease Risk Table                     Men    Women  1/2 Average Risk   3.4   3.3* 08/03/2007   Lab Results  Component Value Date   TRIG 93 08/03/2007   Lab Results  Component Value Date   CHOLHDL 4.7 08/03/2007   No results found for this basename: LDLDIRECT      Radiology: no acute cardiopulmonary disease EKG: NSR, early repolarization  FOLLOW UP PLANS AND APPOINTMENTS  Medication List  As of 01/19/2012  4:42 PM   TAKE these medications         aspirin 81 MG tablet   Take 81 mg by mouth daily.      atorvastatin 40 MG tablet   Commonly known as: LIPITOR   Take 40 mg by mouth daily.      carvedilol 12.5 MG tablet   Commonly known as: COREG   Take 12.5 mg by mouth daily.      citalopram 20 MG tablet   Commonly known as: CELEXA   Take 20 mg by mouth daily.      IBU PO   Take 1 tablet  by mouth 2 (two) times daily as needed. For pain      nitroGLYCERIN 0.4 MG SL tablet   Commonly known as: NITROSTAT   Place 1 tablet (0.4 mg total) under the tongue every 5 (five) minutes x 3 doses as needed for chest pain.           Follow-up Information    Schedule an appointment as soon as possible for a visit in 1 week to follow up. (stress test)          BRING ALL MEDICATIONS WITH YOU TO FOLLOW UP APPOINTMENTS  Time spent with patient to include physician time: 35 minutes including discussion of treatment options with patient and family,  Signed: Wyoma Genson S. 01/19/2012, 4:42 PM

## 2012-01-19 NOTE — ED Notes (Addendum)
Pt report given to [facility], RN at [facility]. Pt report to [facility]. Pt in NAD at time of transfer. IV gtt infusing per MD order, IV sites unremarkable.

## 2013-04-10 ENCOUNTER — Encounter: Payer: Self-pay | Admitting: Cardiology

## 2013-06-27 ENCOUNTER — Ambulatory Visit: Payer: BC Managed Care – PPO | Admitting: Cardiology

## 2013-06-29 ENCOUNTER — Encounter: Payer: Self-pay | Admitting: Cardiology

## 2013-06-29 ENCOUNTER — Ambulatory Visit (INDEPENDENT_AMBULATORY_CARE_PROVIDER_SITE_OTHER): Payer: BC Managed Care – PPO | Admitting: Cardiology

## 2013-06-29 VITALS — BP 141/88 | HR 49 | Ht 70.5 in | Wt 197.0 lb

## 2013-06-29 DIAGNOSIS — I2510 Atherosclerotic heart disease of native coronary artery without angina pectoris: Secondary | ICD-10-CM

## 2013-06-29 DIAGNOSIS — R001 Bradycardia, unspecified: Secondary | ICD-10-CM

## 2013-06-29 DIAGNOSIS — I252 Old myocardial infarction: Secondary | ICD-10-CM

## 2013-06-29 DIAGNOSIS — I498 Other specified cardiac arrhythmias: Secondary | ICD-10-CM

## 2013-06-29 DIAGNOSIS — E7800 Pure hypercholesterolemia, unspecified: Secondary | ICD-10-CM

## 2013-06-29 DIAGNOSIS — I10 Essential (primary) hypertension: Secondary | ICD-10-CM

## 2013-06-29 MED ORDER — CARVEDILOL 12.5 MG PO TABS: 6.2500 mg | ORAL_TABLET | Freq: Two times a day (BID) | ORAL | Status: DC

## 2013-06-29 NOTE — Patient Instructions (Signed)
Your physician has recommended you make the following change in your medication:   1. Decrease Carvedilol to 6.25 mg twice daily.  Your physician wants you to follow-up in: 1 year with Dr. Marlou Porch. You will receive a reminder letter in the mail two months in advance. If you don't receive a letter, please call our office to schedule the follow-up appointment.

## 2013-06-29 NOTE — Progress Notes (Signed)
[place]. [address], [place]  [address] Phone: [phone] Fax:  [phone]  Date:  06/29/2013   ID:  Shawn Walton, DOB 04-15-1951, MRN [id number]  PCP:  Horton Finer, MD   History of Present Illness: Shawn Walton is a 63 y.o. male with coronary artery disease status post myocardial infarction with DES to RCA in February of 2009 with subsequent nuclear stress test demonstrating no ischemia but inferior mid to base scar, exercise for 10 minutes. He has a history of supraventricular tachycardia (controlled on carvedilol). He's also had a history of angioedema on lisinopril. BP mildly elevated.  Palpitations have subsided since he changed up his carvedilol dosing. He states that he is feeling really good on this drug regimen. In regards to lipids, his last LDL (03/16/12 scanned in) was reviewed. Excellent, 75. He is feeling well, playing tennis.  At times of down time at work feel a little tired.   Gets his lipids done at work.    Wt Readings from Last 3 Encounters:  06/29/13 197 lb (89.359 kg)  01/19/12 187 lb 13.3 oz (85.2 kg)     Past Medical History  Diagnosis Date  . MI (myocardial infarction)     Past Surgical History  Procedure Laterality Date  . Stent placemnet      Current Outpatient Prescriptions  Medication Sig Dispense Refill  . aspirin 81 MG tablet Take 81 mg by mouth daily.      [name] Kitchen atorvastatin (LIPITOR) 40 MG tablet Take 40 mg by mouth daily.      . carvedilol (COREG) 12.5 MG tablet Take 12.5 mg by mouth daily.       . citalopram (CELEXA) 20 MG tablet Take 20 mg by mouth daily.      . Ibuprofen (IBU PO) Take 1 tablet by mouth 2 (two) times daily as needed. For pain      . nitroGLYCERIN (NITROSTAT) 0.4 MG SL tablet Place 1 tablet (0.4 mg total) under the tongue every 5 (five) minutes x 3 doses as needed for chest pain.  25 tablet  6   No current facility-administered medications for this visit.    Allergies:      Allergies  Allergen Reactions  . Erythromycin Other (See Comments)    Gi upset    Social History:  The patient  reports that he has never smoked. He does not have any smokeless tobacco history on file. He reports that he drinks alcohol. He reports that he does not use illicit drugs.   ROS:  Please see the history of present illness.   Denies any syncope, orthopnea, PND, chest pain    PHYSICAL EXAM: VS:  BP 141/88  Pulse 49  Ht 5' 10.5" (1.791 m)  Wt 197 lb (89.359 kg)  BMI 27.86 kg/m2 Well nourished, well developed, in no acute distress HEENT: normal Neck: no JVD Cardiac:  normal S1, S2; RRR; no murmur Lungs:  clear to auscultation bilaterally, no wheezing, rhonchi or rales Abd: soft, nontender, no hepatomegaly Ext: no edema Skin: warm and dry Neuro: no focal abnormalities noted  EKG:  Sinus bradycardia rate 49 with no other abnormalities. No change from prior EKG.  ASSESSMENT AND PLAN:  1. Old MI-currently doing well without any anginal symptoms. 2. Coronary artery disease-DES to RCA, 2009 3. Hyperlipidemia-continue with statin therapy. He will bring in a copy of his lab work which was done at work. 4. Hypertension-blood pressure minimally elevated today. Continue to  monitor. 5. Bradycardia-I will decrease his carvedilol from 12.5 down to 6.25 twice a day.  Signed, Candee Furbish, MD [facility]  06/29/2013 4:24 PM

## 2015-08-21 ENCOUNTER — Ambulatory Visit (INDEPENDENT_AMBULATORY_CARE_PROVIDER_SITE_OTHER): Payer: BLUE CROSS/BLUE SHIELD | Admitting: Cardiology

## 2015-08-21 ENCOUNTER — Encounter: Payer: Self-pay | Admitting: Cardiology

## 2015-08-21 VITALS — BP 122/80 | HR 58 | Ht 70.5 in | Wt 195.0 lb

## 2015-08-21 DIAGNOSIS — I2583 Coronary atherosclerosis due to lipid rich plaque: Principal | ICD-10-CM

## 2015-08-21 DIAGNOSIS — I2510 Atherosclerotic heart disease of native coronary artery without angina pectoris: Secondary | ICD-10-CM | POA: Diagnosis not present

## 2015-08-21 DIAGNOSIS — E785 Hyperlipidemia, unspecified: Secondary | ICD-10-CM

## 2015-08-21 DIAGNOSIS — I471 Supraventricular tachycardia, unspecified: Secondary | ICD-10-CM | POA: Insufficient documentation

## 2015-08-21 DIAGNOSIS — I252 Old myocardial infarction: Secondary | ICD-10-CM | POA: Insufficient documentation

## 2015-08-21 DIAGNOSIS — I10 Essential (primary) hypertension: Secondary | ICD-10-CM | POA: Diagnosis not present

## 2015-08-21 DIAGNOSIS — R001 Bradycardia, unspecified: Secondary | ICD-10-CM

## 2015-08-21 HISTORY — DX: Supraventricular tachycardia: I47.1

## 2015-08-21 HISTORY — DX: Atherosclerotic heart disease of native coronary artery without angina pectoris: I25.10

## 2015-08-21 HISTORY — DX: Bradycardia, unspecified: R00.1

## 2015-08-21 HISTORY — DX: Hyperlipidemia, unspecified: E78.5

## 2015-08-21 HISTORY — DX: Supraventricular tachycardia, unspecified: I47.10

## 2015-08-21 MED ORDER — CARVEDILOL 3.125 MG PO TABS: 3.1250 mg | ORAL_TABLET | Freq: Every day | ORAL | Status: DC

## 2015-08-21 NOTE — Progress Notes (Signed)
[place]. [address], [address] [place], [place]  [address] Phone: [phone] Fax:  [phone]  Date:  08/21/2015   ID:  Shawn Walton, DOB 07-12-1950, MRN [id number]  PCP:  Horton Finer, MD   History of Present Illness: Shawn Walton is a 65 y.o. male with coronary artery disease status post myocardial infarction with DES to RCA in February of 2009 with subsequent nuclear stress test demonstrating no ischemia but inferior mid to base scar, exercise for 10 minutes.   He has a history of supraventricular tachycardia (controlled on carvedilol). He's also had a history of angioedema on lisinopril. BP mildly elevated.  LDL (03/16/12 scanned in) was reviewed. Excellent, 75. He is feeling well, playing tennis.  About to retire next year. Overall feeling well. No syncope. He did feel some mild dizziness when turning his head quickly. No chest pain.    Gets his lipids done at work.    Wt Readings from Last 3 Encounters:  08/21/15 195 lb (88.451 kg)  06/29/13 197 lb (89.359 kg)  01/19/12 187 lb 13.3 oz (85.2 kg)     Past Medical History  Diagnosis Date  . MI (myocardial infarction) ([place])   . SVT (supraventricular tachycardia) ([place]) 08/21/2015  . Coronary artery disease due to lipid rich plaque 08/21/2015    DES RCA 2009   . Hyperlipidemia 08/21/2015  . Bradycardia 08/21/2015    Past Surgical History  Procedure Laterality Date  . Stent placemnet      Current Outpatient Prescriptions  Medication Sig Dispense Refill  . ALPRAZolam (XANAX) 0.5 MG tablet Take 0.5 mg by mouth at bedtime as needed for anxiety. Take one-half tablet daily prn    . aspirin 81 MG tablet Take 81 mg by mouth daily.    [name] Kitchen atorvastatin (LIPITOR) 40 MG tablet Take 40 mg by mouth daily.    . carvedilol (COREG) 6.25 MG tablet Take 6.25 mg by mouth daily.    . citalopram (CELEXA) 20 MG tablet Take 20 mg by mouth daily.    . Ibuprofen (IBU PO) Take 1 tablet by mouth 2 (two) times daily as  needed. For pain    . nitroGLYCERIN (NITROSTAT) 0.4 MG SL tablet Place 1 tablet (0.4 mg total) under the tongue every 5 (five) minutes x 3 doses as needed for chest pain. 25 tablet 6   No current facility-administered medications for this visit.    Allergies:    Allergies  Allergen Reactions  . Erythromycin Other (See Comments)    Gi upset    Social History:  The patient  reports that he has never smoked. He does not have any smokeless tobacco history on file. He reports that he drinks alcohol. He reports that he does not use illicit drugs.   ROS:  Please see the history of present illness.   Denies any syncope, orthopnea, PND, chest pain    PHYSICAL EXAM: VS:  BP 122/80 mmHg  Pulse 58  Ht 5' 10.5" (1.791 m)  Wt 195 lb (88.451 kg)  BMI 27.57 kg/m2 Well nourished, well developed, in no acute distress HEENT: normal Neck: no JVD Cardiac:  normal S1, S2; RRR; no murmur Lungs:  clear to auscultation bilaterally, no wheezing, rhonchi or rales Abd: soft, nontender, no hepatomegaly Ext: no edema Skin: warm and dry Neuro: no focal abnormalities noted  EKG:  Sinus bradycardia rate 49 with no other abnormalities. No change from prior EKG.  ASSESSMENT AND PLAN:  1. Old MI-currently doing  well without any anginal symptoms. 2. Coronary artery disease-DES to RCA, 2009 3. Hyperlipidemia-continue with statin therapy.  lab work  done at work. 4. Hypertension-normal today. Continue to monitor. 5. Bradycardia-I decreased his carvedilol from 6.25 daily at night down to 3.125 daily at night. Heart rate was 48 bpm recently. Normal PR interval. He normally has a slow heart rate. He will mean of he has any changes in palpitations. SVT for instance. 6. One-year follow-up. We will see him prior to his retirement. Signed, Candee Furbish, MD [facility]  08/21/2015 9:46 AM

## 2015-08-21 NOTE — Patient Instructions (Addendum)
Medication Instructions:  Your physician has recommended you make the following change in your medication:  1.  DECREASE the Carvedilol ([place]) to 3.125 taking 1 tablet daily   Labwork: None ordered  Testing/Procedures: None ordered  Follow-Up: Your physician wants you to follow-up in: [place] will receive a reminder letter in the mail two months in advance. If you don't receive a letter, please call our office to schedule the follow-up appointment.   Any Other Special Instructions Will Be Listed Below (If Applicable).     If you need a refill on your cardiac medications before your next appointment, please call your pharmacy.

## 2015-10-21 ENCOUNTER — Other Ambulatory Visit: Payer: Self-pay | Admitting: Gastroenterology

## 2015-10-27 ENCOUNTER — Encounter (HOSPITAL_COMMUNITY): Payer: Self-pay | Admitting: *Deleted

## 2015-11-03 ENCOUNTER — Encounter (HOSPITAL_COMMUNITY): Payer: Self-pay | Admitting: Anesthesiology

## 2015-11-03 NOTE — Anesthesia Preprocedure Evaluation (Deleted)
Anesthesia Evaluation  Patient identified by MRN, date of birth, ID band Patient awake    Reviewed: Allergy & Precautions, NPO status , Patient's Chart, lab work & pertinent test results  Airway Mallampati: II  TM Distance: >3 FB Neck ROM: Full    Dental no notable dental hx.    Pulmonary neg pulmonary ROS,    Pulmonary exam normal breath sounds clear to auscultation       Cardiovascular hypertension, Pt. on medications and Pt. on home beta blockers + CAD and + Past MI  Normal cardiovascular exam+ dysrhythmias  Rhythm:Regular Rate:Normal     Neuro/Psych PSYCHIATRIC DISORDERS Anxiety Depression negative neurological ROS     GI/Hepatic negative GI ROS, Neg liver ROS,   Endo/Other  negative endocrine ROS  Renal/GU negative Renal ROS  negative genitourinary   Musculoskeletal negative musculoskeletal ROS (+)   Abdominal   Peds negative pediatric ROS (+)  Hematology negative hematology ROS (+)   Anesthesia Other Findings   Reproductive/Obstetrics negative OB ROS                             Anesthesia Physical Anesthesia Plan  ASA: III  Anesthesia Plan: MAC   Post-op Pain Management:    Induction: Intravenous  Airway Management Planned: Natural Airway  Additional Equipment:   Intra-op Plan:   Post-operative Plan:   Informed Consent: I have reviewed the patients History and Physical, chart, labs and discussed the procedure including the risks, benefits and alternatives for the proposed anesthesia with the patient or authorized representative who has indicated his/her understanding and acceptance.   Dental advisory given  Plan Discussed with: CRNA  Anesthesia Plan Comments:         Anesthesia Quick Evaluation

## 2015-11-04 ENCOUNTER — Ambulatory Visit (HOSPITAL_COMMUNITY)
Admission: RE | Admit: 2015-11-04 | Payer: BLUE CROSS/BLUE SHIELD | Source: Ambulatory Visit | Admitting: Gastroenterology

## 2015-11-04 HISTORY — DX: Major depressive disorder, single episode, unspecified: F32.9

## 2015-11-04 HISTORY — DX: Anxiety disorder, unspecified: F41.9

## 2015-11-04 HISTORY — DX: Depression, unspecified: F32.A

## 2015-11-04 HISTORY — DX: Cardiac arrhythmia, unspecified: I49.9

## 2015-11-04 SURGERY — COLONOSCOPY WITH PROPOFOL
Anesthesia: Monitor Anesthesia Care

## 2016-08-27 ENCOUNTER — Other Ambulatory Visit: Payer: Self-pay | Admitting: Obstetrics and Gynecology

## 2016-08-27 ENCOUNTER — Other Ambulatory Visit: Payer: Self-pay | Admitting: Internal Medicine

## 2016-08-27 DIAGNOSIS — R42 Dizziness and giddiness: Secondary | ICD-10-CM

## 2016-09-02 ENCOUNTER — Other Ambulatory Visit: Payer: Self-pay | Admitting: Gastroenterology

## 2016-09-03 ENCOUNTER — Encounter: Payer: Self-pay | Admitting: *Deleted

## 2016-09-07 ENCOUNTER — Ambulatory Visit
Admission: RE | Admit: 2016-09-07 | Discharge: 2016-09-07 | Disposition: A | Discharge status: Home / Self Care | Payer: BLUE CROSS/BLUE SHIELD | Source: Ambulatory Visit | Attending: Internal Medicine | Admitting: Internal Medicine

## 2016-09-07 DIAGNOSIS — R42 Dizziness and giddiness: Secondary | ICD-10-CM

## 2016-09-20 ENCOUNTER — Ambulatory Visit (INDEPENDENT_AMBULATORY_CARE_PROVIDER_SITE_OTHER): Payer: BLUE CROSS/BLUE SHIELD | Admitting: Cardiology

## 2016-09-20 ENCOUNTER — Encounter (INDEPENDENT_AMBULATORY_CARE_PROVIDER_SITE_OTHER): Payer: Self-pay

## 2016-09-20 ENCOUNTER — Encounter: Payer: Self-pay | Admitting: Cardiology

## 2016-09-20 VITALS — BP 124/82 | HR 46 | Ht 70.0 in | Wt 196.6 lb

## 2016-09-20 DIAGNOSIS — I2510 Atherosclerotic heart disease of native coronary artery without angina pectoris: Secondary | ICD-10-CM

## 2016-09-20 DIAGNOSIS — I10 Essential (primary) hypertension: Secondary | ICD-10-CM | POA: Diagnosis not present

## 2016-09-20 DIAGNOSIS — I252 Old myocardial infarction: Secondary | ICD-10-CM

## 2016-09-20 DIAGNOSIS — E7800 Pure hypercholesterolemia, unspecified: Secondary | ICD-10-CM | POA: Diagnosis not present

## 2016-09-20 DIAGNOSIS — I2583 Coronary atherosclerosis due to lipid rich plaque: Secondary | ICD-10-CM | POA: Diagnosis not present

## 2016-09-20 NOTE — Progress Notes (Signed)
Cardiology Office Note    Date:  09/20/2016   ID:  Shawn Walton, DOB Aug 14, 1950, MRN [id number]  PCP:  Shawn Cha, MD  Cardiologist:   Candee Furbish, MD   No chief complaint on file.   History of Present Illness:  Shawn Walton is a 66 y.o. male with coronary artery disease status post RCA DES February 2009 status post myocardial infarction with history of supraventricular tachycardia controlled on carvedilol. Here for follow-up.  Has a history of angioedema on lisinopril.  He has had a nuclear stress test after his stent which showed no ischemia but inferior mid to base scar. He exercised for 10 minutes  He did have a few episodes of vertiginous-like symptoms when he was bending over and getting back up feeling as though he was going to fall over if he twisted his head too quickly. Carotid Dopplers were performed and were reassuring.  He denies chest pain, fevers, chills, orthopnea, PND. He has had persistent bradycardia. He denies any vision changes with position. Blood pressure has been under good control.  Has a 2017 , Camaro convertible. He just retired    Past Medical History:  Diagnosis Date  . Anxiety   . Bradycardia 08/21/2015  . Coronary artery disease due to lipid rich plaque 08/21/2015   DES RCA 2009   . Depression   . Dysrhythmia    irregular  . Hyperlipidemia 08/21/2015  . MI (myocardial infarction) ([place])   . SVT (supraventricular tachycardia) ([place]) 08/21/2015    Past Surgical History:  Procedure Laterality Date  . CORONARY ANGIOPLASTY    . stent placemnet      Current Medications: Outpatient Medications Prior to Visit  Medication Sig Dispense Refill  . ALPRAZolam (XANAX) 0.5 MG tablet Take 0.25 mg by mouth at bedtime as needed for anxiety.     [name] Kitchen aspirin EC 81 MG tablet Take 81 mg by mouth daily.    [name] Kitchen atorvastatin (LIPITOR) 40 MG tablet Take 40 mg by mouth daily.    . citalopram (CELEXA) 20 MG tablet Take 20 mg by mouth daily before  breakfast.     . nitroGLYCERIN (NITROSTAT) 0.4 MG SL tablet Place 1 tablet (0.4 mg total) under the tongue every 5 (five) minutes x 3 doses as needed for chest pain. 25 tablet 6  . carvedilol (COREG) 3.125 MG tablet Take 1 tablet (3.125 mg total) by mouth daily. (Patient taking differently: Take 3.125 mg by mouth at bedtime. ) 90 tablet 3  . Ibuprofen (IBU PO) Take 1 tablet by mouth 2 (two) times daily as needed. For pain     No facility-administered medications prior to visit.      Allergies:   Erythromycin and Lisinopril   Social History   Social History  . Marital status: Married    Spouse name: N/A  . Number of children: N/A  . Years of education: N/A   Social History Main Topics  . Smoking status: Never Smoker  . Smokeless tobacco: Never Used  . Alcohol use Yes     Comment: 1-2 of amy each day   . Drug use: No  . Sexual activity: Not Asked   Other Topics Concern  . None   Social History Narrative  . None     Family History:  There is no early family history of coronary artery disease.   ROS:   Please see the history of present illness.    ROS All other systems reviewed and are negative.  PHYSICAL EXAM:   VS:  BP 124/82   Pulse (!) 46   Ht 5\' 10"  (1.778 m)   Wt 196 lb 9.6 oz (89.2 kg)   BMI 28.21 kg/m    GEN: Well nourished, well developed, in no acute distress  HEENT: normal  Neck: no JVD, carotid bruits, or masses Cardiac: brady reg; no murmurs, rubs, or gallops,no edema  Respiratory:  clear to auscultation bilaterally, normal work of breathing GI: soft, nontender, nondistended, + BS MS: no deformity or atrophy  Skin: warm and dry, no rash Neuro:  Alert and Oriented x 3, Strength and sensation are intact Psych: euthymic mood, full affect  Wt Readings from Last 3 Encounters:  09/20/16 196 lb 9.6 oz (89.2 kg)  08/21/15 195 lb (88.5 kg)  06/29/13 197 lb (89.4 kg)      Studies/Labs Reviewed:   EKG:  EKG is ordered today.  The ekg ordered today  demonstrates Sinus bradycardia rate 46 with no other abnormality. Previous heart rate was 48 on old EKG personally viewed.  Recent Labs: No results found for requested labs within last 8760 hours.   Lipid Panel    Component Value Date/Time   CHOL  08/03/2007 0550    154        ATP III CLASSIFICATION:  <200     mg/dL   Desirable  200-239  mg/dL   Borderline High  >=240    mg/dL   High   TRIG 93 08/03/2007 0550   HDL 33 (L) 08/03/2007 0550   CHOLHDL 4.7 08/03/2007 0550   VLDL 19 08/03/2007 0550   LDLCALC (H) 08/03/2007 0550    102        Total Cholesterol/HDL:CHD Risk Coronary Heart Disease Risk Table                     Men   Women  1/2 Average Risk   3.4   3.3    Additional studies/ records that were reviewed today include:  No changes. Records reviewed, EKG reviewed, lab work reviewed from outside office visit.    ASSESSMENT:    1. Old MI (myocardial infarction)   2. Essential hypertension   3. Coronary artery disease due to lipid rich plaque   4. Pure hypercholesterolemia      PLAN:  In order of problems listed above:  Coronary artery disease status post myocardial infarction in 2009 with RCA stent, DES  - Overall doing well, no anginal symptoms.  Hyperlipidemia  - Statin therapy. His PCP is following lab work.  Essential hypertension  - Well controlled. Continue to monitor.  Bradycardia  - We are going to stop his carvedilol 3.125 mg once a day. His heart rate is still bradycardic, 46 on ECG. Watch for any signs of palpitations, SVT.      Medication Adjustments/Labs and Tests Ordered: Current medicines are reviewed at length with the patient today.  Concerns regarding medicines are outlined above.  Medication changes, Labs and Tests ordered today are listed in the Patient Instructions below. Patient Instructions  Medication Instructions:  Please discontinue your carvedilol. Continue all other medications as listed.  Follow-Up: Follow up in 1 year  with Dr. Marlou Porch.  You will receive a letter in the mail 2 months before you are due.  Please call us when you receive this letter to schedule your follow up appointment.  Thank you for choosing [facility]!!           Signed, Candee Furbish,  MD  09/20/2016 2:52 PM    [facility] [address], [place], [place]  [address] Phone: [phone]; Fax: [phone]

## 2016-09-20 NOTE — Patient Instructions (Signed)
Medication Instructions:  Please discontinue your carvedilol. Continue all other medications as listed.  Follow-Up: Follow up in 1 year with Dr. Marlou Porch.  You will receive a letter in the mail 2 months before you are due.  Please call us when you receive this letter to schedule your follow up appointment.  Thank you for choosing [facility]!!

## 2016-10-26 ENCOUNTER — Encounter (HOSPITAL_COMMUNITY): Payer: Self-pay

## 2016-10-26 ENCOUNTER — Ambulatory Visit (HOSPITAL_COMMUNITY): Admit: 2016-10-26 | Payer: BLUE CROSS/BLUE SHIELD | Admitting: Gastroenterology

## 2016-10-26 SURGERY — COLONOSCOPY WITH PROPOFOL
Anesthesia: Monitor Anesthesia Care

## 2016-11-16 ENCOUNTER — Encounter (HOSPITAL_COMMUNITY)
Admission: RE | Disposition: A | Discharge status: Home / Self Care | Payer: Self-pay | Source: Ambulatory Visit | Attending: Gastroenterology

## 2016-11-16 ENCOUNTER — Ambulatory Visit (HOSPITAL_COMMUNITY): Payer: BLUE CROSS/BLUE SHIELD | Admitting: Anesthesiology

## 2016-11-16 ENCOUNTER — Ambulatory Visit (HOSPITAL_COMMUNITY)
Admission: RE | Admit: 2016-11-16 | Discharge: 2016-11-16 | Disposition: A | Discharge status: Home / Self Care | Payer: BLUE CROSS/BLUE SHIELD | Source: Ambulatory Visit | Attending: Gastroenterology | Admitting: Gastroenterology

## 2016-11-16 ENCOUNTER — Other Ambulatory Visit: Payer: Self-pay | Admitting: Gastroenterology

## 2016-11-16 ENCOUNTER — Encounter (HOSPITAL_COMMUNITY): Payer: Self-pay | Admitting: *Deleted

## 2016-11-16 DIAGNOSIS — I10 Essential (primary) hypertension: Secondary | ICD-10-CM | POA: Diagnosis not present

## 2016-11-16 DIAGNOSIS — Z881 Allergy status to other antibiotic agents status: Secondary | ICD-10-CM | POA: Diagnosis not present

## 2016-11-16 DIAGNOSIS — Z955 Presence of coronary angioplasty implant and graft: Secondary | ICD-10-CM | POA: Insufficient documentation

## 2016-11-16 DIAGNOSIS — Z888 Allergy status to other drugs, medicaments and biological substances status: Secondary | ICD-10-CM | POA: Diagnosis not present

## 2016-11-16 DIAGNOSIS — I252 Old myocardial infarction: Secondary | ICD-10-CM | POA: Diagnosis not present

## 2016-11-16 DIAGNOSIS — Z1211 Encounter for screening for malignant neoplasm of colon: Secondary | ICD-10-CM | POA: Diagnosis not present

## 2016-11-16 DIAGNOSIS — I471 Supraventricular tachycardia: Secondary | ICD-10-CM | POA: Insufficient documentation

## 2016-11-16 DIAGNOSIS — D122 Benign neoplasm of ascending colon: Secondary | ICD-10-CM | POA: Insufficient documentation

## 2016-11-16 DIAGNOSIS — E7800 Pure hypercholesterolemia, unspecified: Secondary | ICD-10-CM | POA: Diagnosis not present

## 2016-11-16 DIAGNOSIS — I2510 Atherosclerotic heart disease of native coronary artery without angina pectoris: Secondary | ICD-10-CM | POA: Diagnosis not present

## 2016-11-16 DIAGNOSIS — D124 Benign neoplasm of descending colon: Secondary | ICD-10-CM | POA: Insufficient documentation

## 2016-11-16 DIAGNOSIS — Z86010 Personal history of colonic polyps: Secondary | ICD-10-CM | POA: Insufficient documentation

## 2016-11-16 HISTORY — PX: COLONOSCOPY WITH PROPOFOL: SHX5780

## 2016-11-16 SURGERY — COLONOSCOPY WITH PROPOFOL
Anesthesia: Monitor Anesthesia Care

## 2016-11-16 MED ORDER — LACTATED RINGERS IV SOLN
INTRAVENOUS | Status: DC
  Administered 2016-11-16: 14:00:00 via INTRAVENOUS

## 2016-11-16 MED ORDER — ONDANSETRON HCL 4 MG/2ML IJ SOLN
INTRAMUSCULAR | Status: AC
  Filled 2016-11-16: qty 2

## 2016-11-16 MED ORDER — PROPOFOL 10 MG/ML IV BOLUS
INTRAVENOUS | Status: DC | PRN
  Administered 2016-11-16 (×3): 20 mg via INTRAVENOUS

## 2016-11-16 MED ORDER — PROPOFOL 10 MG/ML IV BOLUS
INTRAVENOUS | Status: AC
  Filled 2016-11-16: qty 40

## 2016-11-16 MED ORDER — ONDANSETRON HCL 4 MG/2ML IJ SOLN
INTRAMUSCULAR | Status: DC | PRN
  Administered 2016-11-16: 4 mg via INTRAVENOUS

## 2016-11-16 MED ORDER — PROPOFOL 500 MG/50ML IV EMUL
INTRAVENOUS | Status: DC | PRN
  Administered 2016-11-16: 200 ug/kg/min via INTRAVENOUS

## 2016-11-16 MED ORDER — PROPOFOL 10 MG/ML IV BOLUS
INTRAVENOUS | Status: AC
  Filled 2016-11-16: qty 20

## 2016-11-16 SURGICAL SUPPLY — 22 items

## 2016-11-16 NOTE — Op Note (Signed)
[facility] Patient Name: Shawn Walton Procedure Date: 11/16/2016 MRN: [id number] Attending MD: Garlan Fair , MD Date of Birth: 07/08/1950 CSN: [id number] Age: 66 Admit Type: Outpatient Procedure:                Colonoscopy Indications:              High risk colon cancer surveillance: Personal                            history of non-advanced adenoma Providers:                Garlan Fair, MD, Burtis Junes, RN, Tinnie Gens,                            Technician Referring MD:              Medicines:                Propofol per Anesthesia Complications:            No immediate complications. Estimated Blood Loss:     Estimated blood loss was minimal. Procedure:                Pre-Anesthesia Assessment:                           - Prior to the procedure, a History and Physical                            was performed, and patient medications and                            allergies were reviewed. The patient's tolerance of                            previous anesthesia was also reviewed. The risks                            and benefits of the procedure and the sedation                            options and risks were discussed with the patient.                            All questions were answered, and informed consent                            was obtained. Prior Anticoagulants: The patient has                            taken aspirin, last dose was 1 day prior to                            procedure. ASA Grade Assessment: II - A patient  with mild systemic disease. After reviewing the                            risks and benefits, the patient was deemed in                            satisfactory condition to undergo the procedure.                           After obtaining informed consent, the colonoscope                            was passed under direct vision. Throughout the                            procedure, the  patient's blood pressure, pulse, and                            oxygen saturations were monitored continuously. The                            EC-[id number] ([id number]) scope was introduced through                            the anus and advanced to the the cecum, identified                            by appendiceal orifice and ileocecal valve. The                            colonoscopy was somewhat difficult due to                            significant looping. The patient tolerated the                            procedure well. The quality of the bowel                            preparation was good. The appendiceal orifice and                            the rectum were photographed. Scope In: 2:05:05 PM Scope Out: 2:44:36 PM Scope Withdrawal Time: 0 hours 20 minutes 50 seconds  Total Procedure Duration: 0 hours 39 minutes 31 seconds  Findings:      The perianal and digital rectal examinations were normal.      A 12 mm polyp was found in the proximal ascending colon. The polyp was       semi-pedunculated. The polyp was removed with a piecemeal technique       using a hot snare. Resection and retrieval were complete.      A 5 mm polyp was found in the distal ascending colon. The polyp was       sessile. The polyp was removed with a  hot snare. Resection and retrieval       were complete.      A 3 mm polyp was found in the descending colon. The polyp was sessile.       The polyp was removed with a cold biopsy forceps. Resection and       retrieval were complete.      The exam was otherwise without abnormality. Impression:               - One 12 mm polyp in the proximal ascending colon,                            removed piecemeal using a hot snare. Resected and                            retrieved.                           - One 5 mm polyp in the distal ascending colon,                            removed with a hot snare. Resected and retrieved.                           - One 3 mm polyp in  the descending colon, removed                            with a cold biopsy forceps. Resected and retrieved.                           - The examination was otherwise normal. Moderate Sedation:      N/A- Per Anesthesia Care Recommendation:           - Patient has a contact number available for                            emergencies. The signs and symptoms of potential                            delayed complications were discussed with the                            patient. Return to normal activities tomorrow.                            Written discharge instructions were provided to the                            patient.                           - Repeat colonoscopy date to be determined after                            pending pathology results are reviewed for  surveillance.                           - Resume previous diet.                           - Continue present medications. Procedure Code(s):        --- Professional ---                           [phone], Colonoscopy, flexible; with removal of                            tumor(s), polyp(s), or other lesion(s) by snare                            technique                           45380, 79, Colonoscopy, flexible; with biopsy,                            single or multiple Diagnosis Code(s):        --- Professional ---                           D12.2, Benign neoplasm of ascending colon                           D12.4, Benign neoplasm of descending colon                           Z86.010, Personal history of colonic polyps CPT copyright 2016 American [employer] Association. All rights reserved. The codes documented in this report are preliminary and upon coder review may  be revised to meet current compliance requirements. Earle Gell, MD Garlan Fair, MD 11/16/2016 2:52:22 PM This report has been signed electronically. Number of Addenda: 0

## 2016-11-16 NOTE — Discharge Instructions (Signed)

## 2016-11-16 NOTE — Transfer of Care (Signed)
Immediate Anesthesia Transfer of Care Note  Patient: Shawn Walton  Procedure(s) Performed: Procedure(s): COLONOSCOPY WITH PROPOFOL (N/A)  Patient Location: PACU and Endoscopy Unit  Anesthesia Type:MAC  Level of Consciousness: awake and alert   Airway & Oxygen Therapy: Patient Spontanous Breathing  Post-op Assessment: Report given to RN and Post -op Vital signs reviewed and stable  Post vital signs: Reviewed and stable  Last Vitals:  Vitals:   11/16/16 1300 11/16/16 1450  BP: 140/77 139/76  Pulse: (!) 52 (!) 59  Resp: 14 12  Temp: 36.7 C 36.4 C    Last Pain:  Vitals:   11/16/16 1450  TempSrc: Oral         Complications: No apparent anesthesia complications

## 2016-11-16 NOTE — Anesthesia Preprocedure Evaluation (Signed)
Anesthesia Evaluation  Patient identified by MRN, date of birth, ID band Patient awake    Reviewed: Allergy & Precautions, NPO status , Patient's Chart, lab work & pertinent test results  Airway Mallampati: II  TM Distance: >3 FB Neck ROM: Full    Dental no notable dental hx.    Pulmonary neg pulmonary ROS,    Pulmonary exam normal breath sounds clear to auscultation       Cardiovascular + CAD, + Past MI and + Cardiac Stents  Normal cardiovascular exam Rhythm:Regular Rate:Normal     Neuro/Psych negative neurological ROS  negative psych ROS   GI/Hepatic negative GI ROS, Neg liver ROS,   Endo/Other  negative endocrine ROS  Renal/GU negative Renal ROS  negative genitourinary   Musculoskeletal negative musculoskeletal ROS (+)   Abdominal   Peds negative pediatric ROS (+)  Hematology negative hematology ROS (+)   Anesthesia Other Findings   Reproductive/Obstetrics negative OB ROS                             Anesthesia Physical Anesthesia Plan  ASA: III  Anesthesia Plan: MAC   Post-op Pain Management:    Induction: Intravenous  PONV Risk Score and Plan: 0  Airway Management Planned: Nasal Cannula  Additional Equipment:   Intra-op Plan:   Post-operative Plan:   Informed Consent: I have reviewed the patients History and Physical, chart, labs and discussed the procedure including the risks, benefits and alternatives for the proposed anesthesia with the patient or authorized representative who has indicated his/her understanding and acceptance.   Dental advisory given  Plan Discussed with: CRNA and Surgeon  Anesthesia Plan Comments:         Anesthesia Quick Evaluation

## 2016-11-16 NOTE — H&P (Signed)
Procedure: Surveillance colonoscopy. 09/07/2010 colonoscopy was performed with removal of two small tubular adenomatous polyps  History: The patient is a 66 year old male born 05/19/1951. He is scheduled to undergo a surveillance colonoscopy today.  Medication allergies: Lisinopril and erythromycin  Past medical history: Coronary artery disease complicated by non-ST segment elevation myocardial infarction. Drug eluding coronary artery stent placement in the right coronary artery in February 2009. Hypertension. History of supraventricular tachycardia. Hypercholesterolemia. Hypertension.  Exam: The patient is alert and lying comfortably on the endoscopy stretcher. Abdomen is soft and nontender to palpation. Lungs are clear to auscultation. Cardiac exam reveals a regular rhythm.  Plan: Proceed with surveillance colonoscopy

## 2016-11-17 ENCOUNTER — Encounter (HOSPITAL_COMMUNITY): Payer: Self-pay | Admitting: Gastroenterology

## 2016-11-17 NOTE — Anesthesia Postprocedure Evaluation (Signed)
Anesthesia Post Note  Patient: Shawn Walton  Procedure(s) Performed: Procedure(s) (LRB): COLONOSCOPY WITH PROPOFOL (N/A)     Patient location during evaluation: PACU Anesthesia Type: MAC Level of consciousness: awake and alert Pain management: pain level controlled Vital Signs Assessment: post-procedure vital signs reviewed and stable Respiratory status: spontaneous breathing, nonlabored ventilation, respiratory function stable and patient connected to nasal cannula oxygen Cardiovascular status: stable and blood pressure returned to baseline Anesthetic complications: no    Last Vitals:  Vitals:   11/16/16 1500 11/16/16 1510  BP: 137/83 (!) 146/88  Pulse: (!) 54 (!) 54  Resp: 13 14  Temp:      Last Pain:  Vitals:   11/16/16 1450  TempSrc: Oral                 [name]

## 2017-09-12 ENCOUNTER — Encounter: Payer: Self-pay | Admitting: Cardiology

## 2017-09-21 ENCOUNTER — Ambulatory Visit: Payer: BLUE CROSS/BLUE SHIELD | Admitting: Cardiology

## 2017-09-29 ENCOUNTER — Encounter: Payer: Self-pay | Admitting: Nurse Practitioner

## 2017-10-17 ENCOUNTER — Ambulatory Visit (INDEPENDENT_AMBULATORY_CARE_PROVIDER_SITE_OTHER): Payer: BLUE CROSS/BLUE SHIELD | Admitting: Nurse Practitioner

## 2017-10-17 ENCOUNTER — Encounter: Payer: Self-pay | Admitting: Nurse Practitioner

## 2017-10-17 VITALS — BP 140/78 | HR 52 | Ht 70.0 in | Wt 197.0 lb

## 2017-10-17 DIAGNOSIS — I2583 Coronary atherosclerosis due to lipid rich plaque: Secondary | ICD-10-CM | POA: Diagnosis not present

## 2017-10-17 DIAGNOSIS — R5383 Other fatigue: Secondary | ICD-10-CM | POA: Diagnosis not present

## 2017-10-17 DIAGNOSIS — I2510 Atherosclerotic heart disease of native coronary artery without angina pectoris: Secondary | ICD-10-CM | POA: Diagnosis not present

## 2017-10-17 DIAGNOSIS — I10 Essential (primary) hypertension: Secondary | ICD-10-CM | POA: Diagnosis not present

## 2017-10-17 DIAGNOSIS — E7800 Pure hypercholesterolemia, unspecified: Secondary | ICD-10-CM | POA: Diagnosis not present

## 2017-10-17 LAB — HEPATIC FUNCTION PANEL
ALT: 23 IU/L (ref 0–44)
AST: 24 IU/L (ref 0–40)
Albumin: 4.5 g/dL (ref 3.6–4.8)
Alkaline Phosphatase: 62 IU/L (ref 39–117)
Bilirubin Total: 0.8 mg/dL (ref 0.0–1.2)
Bilirubin, Direct: 0.21 mg/dL (ref 0.00–0.40)
Total Protein: 6.9 g/dL (ref 6.0–8.5)

## 2017-10-17 LAB — BASIC METABOLIC PANEL
BUN/Creatinine Ratio: 14 (ref 10–24)
BUN: 15 mg/dL (ref 8–27)
CO2: 25 mmol/L (ref 20–29)
Calcium: 9.2 mg/dL (ref 8.6–10.2)
Chloride: 103 mmol/L (ref 96–106)
Creatinine, Ser: 1.06 mg/dL (ref 0.76–1.27)
GFR calc Af Amer: 85 mL/min/{1.73_m2} (ref >59)
GFR calc non Af Amer: 74 mL/min/{1.73_m2} (ref >59)
Glucose: 84 mg/dL (ref 65–99)
Potassium: 4.4 mmol/L (ref 3.5–5.2)
Sodium: 142 mmol/L (ref 134–144)

## 2017-10-17 LAB — LIPID PANEL
Chol/HDL Ratio: 2.6 ratio (ref 0.0–5.0)
Cholesterol, Total: 143 mg/dL (ref 100–199)
HDL: 54 mg/dL (ref >39)
LDL Calculated: 60 mg/dL (ref 0–99)
Triglycerides: 145 mg/dL (ref 0–149)
VLDL Cholesterol Cal: 29 mg/dL (ref 5–40)

## 2017-10-17 LAB — TSH: TSH: 1.67 u[IU]/mL (ref 0.450–4.500)

## 2017-10-17 NOTE — Patient Instructions (Addendum)
We will be checking the following labs today - BMET, HPF, Lipids and TSH   Medication Instructions:    Continue with your current medicines.     Testing/Procedures To Be Arranged:  N/A  Follow-Up:   See Dr. Marlou Porch in one year    Other Special Instructions:   N/A    If you need a refill on your cardiac medications before your next appointment, please call your pharmacy.   Call the [place] office at [phone] if you have any questions, problems or concerns.

## 2017-10-17 NOTE — Progress Notes (Addendum)
CARDIOLOGY OFFICE NOTE  Date:  10/17/2017    Shawn Walton Date of Birth: 11-13-1950 Medical Record #[id number]  PCP:  Leeroy Cha, MD  Cardiologist:  [facility]    Chief Complaint  Patient presents with  . Coronary Artery Disease    1 year check - seen for Dr. Marlou Porch    History of Present Illness: Shawn Walton is a 67 y.o. male who presents today for a one year check. Seen for Dr. Marlou Porch.   He has a history of CAD with prior MI with RCA DES February 2009, supraventricular tachycardia previously controlled on carvedilol. He has had angioedema with ACE.   Last seen a year ago - was felt to be doing well. His Coreg was stopped due to marked bradycardia.   Comes in today. Here alone. Doing well. He remains fairly active. Playing tennis and golf. Did join a tennis club down in [place]. Some fatigue - he feels like this comes from being too "tense". No chest pain. Breathing is ok. Tolerating his medicines. No labs since last year. No dizzy spells. No spells of fast heart beating that has been bothersome. No real concerns and overall he feels like he is doing well.   Past Medical History:  Diagnosis Date  . Anxiety   . Bradycardia 08/21/2015  . Coronary artery disease due to lipid rich plaque 08/21/2015   DES RCA 2009   . Depression   . Dysrhythmia    irregular  . Hyperlipidemia 08/21/2015  . MI (myocardial infarction) ([place])   . SVT (supraventricular tachycardia) ([place]) 08/21/2015    Past Surgical History:  Procedure Laterality Date  . COLONOSCOPY WITH PROPOFOL N/A 11/16/2016   Procedure: COLONOSCOPY WITH PROPOFOL;  Surgeon: Garlan Fair, MD;  Location: [facility];  Service: Endoscopy;  Laterality: N/A;  . CORONARY ANGIOPLASTY    . stent placemnet       Medications: Current Meds  Medication Sig  . acetaminophen (TYLENOL) 500 MG tablet Take 500 mg by mouth 2 (two) times daily as needed for mild pain or headache.  . ALPRAZolam (XANAX) 0.5 MG  tablet Take 0.25 mg by mouth at bedtime as needed for anxiety.   [name] Kitchen aspirin EC 81 MG tablet Take 81 mg by mouth daily.  [name] Kitchen atorvastatin (LIPITOR) 40 MG tablet Take 40 mg by mouth every evening.   . citalopram (CELEXA) 20 MG tablet Take 20 mg by mouth daily before breakfast.      Allergies: Allergies  Allergen Reactions  . Erythromycin Other (See Comments)    Gi upset  . Lisinopril Swelling    Social History: The patient  reports that he has never smoked. He has never used smokeless tobacco. He reports that he drinks alcohol. He reports that he does not use drugs.   Family History: The patient's family history includes Stroke in his father.   Review of Systems: Please see the history of present illness.   Otherwise, the review of systems is positive for none.   All other systems are reviewed and negative.   Physical Exam: VS:  BP 140/78 (BP Location: Left Arm, Patient Position: Sitting, Cuff Size: Normal)   Pulse (!) 52   Ht 5\' 10"  (1.778 m)   Wt 197 lb (89.4 kg)   BMI 28.27 kg/m  .  BMI Body mass index is 28.27 kg/m.  Wt Readings from Last 3 Encounters:  10/17/17 197 lb (89.4 kg)  11/16/16 185 lb (83.9 kg)  09/20/16 196 lb  9.6 oz (89.2 kg)   BP recheck by me is 128/80  General: Pleasant. Well developed, well nourished and in no acute distress.   HEENT: Normal.  Neck: Supple, no JVD, carotid bruits, or masses noted.  Cardiac: Regular rate and rhythm. No murmurs, rubs, or gallops. No edema.  Respiratory:  Lungs are clear to auscultation bilaterally with normal work of breathing.  GI: Soft and nontender.  MS: No deformity or atrophy. Gait and ROM intact.  Skin: Warm and dry. Color is normal.  Neuro:  Strength and sensation are intact and no gross focal deficits noted.  Psych: Alert, appropriate and with normal affect.   LABORATORY DATA:  EKG:  EKG is ordered today. This demonstrates sinus brady - HR is 52 today.  Lab Results  Component Value Date   WBC 8.2  01/19/2012   HGB 15.2 01/19/2012   HCT 44.5 01/19/2012   PLT 185 01/19/2012   GLUCOSE 110 (H) 01/19/2012   CHOL  08/03/2007    154        ATP III CLASSIFICATION:  <200     mg/dL   Desirable  200-239  mg/dL   Borderline High  >=240    mg/dL   High   TRIG 93 08/03/2007   HDL 33 (L) 08/03/2007   LDLCALC (H) 08/03/2007    102        Total Cholesterol/HDL:CHD Risk Coronary Heart Disease Risk Table                     Men   Women  1/2 Average Risk   3.4   3.3   ALT 27 01/19/2012   AST 26 01/19/2012   NA 142 01/19/2012   K 3.8 01/19/2012   CL 101 01/19/2012   CREATININE 1.00 01/19/2012   BUN 18 01/19/2012   CO2 31 01/19/2012   TSH 1.305 Test methodology is 3rd generation TSH 08/03/2007   INR 1.05 01/19/2012   HGBA1C 5.2 01/19/2012     BNP (last 3 results) No results for input(s): BNP in the last 8760 hours.  ProBNP (last 3 results) No results for input(s): PROBNP in the last 8760 hours.   Other Studies Reviewed Today:   Assessment/Plan:  1. CAD with remote MI and prior DES to the RCA in 2009 - he continues to do well clinically. Encouraged to continue with CV risk factor modification. Lab today.   2. HLD - remains on statin - lab today  3. SVT- no longer on beta blocker therapy - does not seem to be an issue at this time.   4. HTN - recheck by me is fine - no changes made today.  5. Fatigue - he feels this is stress related - considering yoga. Will check TSh.   6. History of bradycardia - no longer on Coreg - HR stable today - basically asymptomatic.  Current medicines are reviewed with the patient today.  The patient does not have concerns regarding medicines other than what has been noted above.  The following changes have been made:  See above.  Labs/ tests ordered today include:    Orders Placed This Encounter  Procedures  . Basic metabolic panel  . Hepatic function panel  . Lipid panel  . TSH  . EKG 12-Lead     Disposition:   FU with Dr. Marlou Porch  in one year.   Patient is agreeable to this plan and will call if any problems develop in the interim.   Signed:  Truitt Merle, NP  10/17/2017 9:44 AM  [facility] [address] [place], [place]  [address] Phone: [phone] Fax: [phone]

## 2017-11-21 ENCOUNTER — Telehealth: Payer: Self-pay | Admitting: Cardiology

## 2017-11-21 NOTE — Telephone Encounter (Signed)
New Message:      Pt would like to know if it is okay to use and electronic pain relief device?

## 2017-11-21 NOTE — Telephone Encounter (Signed)
Spoke with the pts Wife (on [place]) and she was asking on behalf of the pt if there is any contraindication for the pt to use a TENS UNIT as needed for pain.  Noted that the pt has no cardiac device implanted.  Informed the pts wife that I'm certain there is no contraindication with using a TENS UNIT as needed with this pts cardiac history, but I will still want to route this information to Dr Marlou Porch and his RN for further reassurance, before he uses this device. Informed the pts wife that once Dr Marlou Porch advises, either a triage Nurse or his RN will call them back shortly thereafter.  Wife verbalized understanding and agrees with this plan. Wife gracious for the prompt follow-up.

## 2017-11-23 NOTE — Telephone Encounter (Signed)
Okay to use TENS unit from cardiac perspective. Candee Furbish, MD

## 2017-11-24 NOTE — Telephone Encounter (Signed)
Wife (on [place]) aware OK to use TENS.

## 2018-09-28 NOTE — Telephone Encounter (Signed)
I am fine refilling his atorvastatin however with the increase in LDL, I would like for him to try atorvastatin 80mg  PO QD and recheck Lipids in 3 months.   Goal LDL <70  Candee Furbish, MD

## 2018-09-29 ENCOUNTER — Other Ambulatory Visit: Payer: Self-pay | Admitting: *Deleted

## 2018-09-29 MED ORDER — ATORVASTATIN CALCIUM 80 MG PO TABS: 80.0000 mg | ORAL_TABLET | Freq: Every day | ORAL | 3 refills | Status: DC

## 2018-09-29 NOTE — Progress Notes (Signed)
rx for atorvastatin 80 mg #90 X 3 sent into mail order pharmacy as requested.

## 2018-10-10 ENCOUNTER — Telehealth: Payer: Self-pay

## 2018-10-10 NOTE — Telephone Encounter (Signed)
YOUR CARDIOLOGY TEAM HAS ARRANGED FOR AN E-VISIT FOR YOUR APPOINTMENT - PLEASE REVIEW IMPORTANT INFORMATION BELOW SEVERAL DAYS PRIOR TO YOUR APPOINTMENT  Due to the recent COVID-19 pandemic, we are transitioning in-person office visits to tele-medicine visits in an effort to decrease unnecessary exposure to our patients, their families, and staff. These visits are billed to your insurance just like a normal visit is. We also encourage you to sign up for MyChart if you have not already done so. You will need a smartphone if possible. For patients that do not have this, we can still complete the visit using a regular telephone but do prefer a smartphone to enable video when possible. You may have a family member that lives with you that can help. If possible, we also ask that you have a blood pressure cuff and scale at home to measure your blood pressure, heart rate and weight prior to your scheduled appointment. Patients with clinical needs that need an in-person evaluation and testing will still be able to come to the office if absolutely necessary. If you have any questions, feel free to call our office.     YOUR PROVIDER WILL BE USING THE FOLLOWING PLATFORM TO COMPLETE YOUR VISIT: Doximity  . IF USING MYCHART - How to Download the MyChart App to Your SmartPhone   - If Apple, go to App Store and type in MyChart in the search bar and download the app. If Android, ask patient to go to Google Play Store and type in MyChart in the search bar and download the app. The app is free but as with any other app downloads, your phone may require you to verify saved payment information or Apple/Android password.  - You will need to then log into the app with your MyChart username and password, and select [facility] as your healthcare provider to link the account.  - When it is time for your visit, go to the MyChart app, find appointments, and click Begin Video Visit. Be sure to Select Allow for your device to  access the Microphone and Camera for your visit. You will then be connected, and your provider will be with you shortly.  **If you have any issues connecting or need assistance, please contact MyChart service desk [phone] ([phone])**  **If using a computer, in order to ensure the best quality for your visit, you will need to use either of the following Internet Browsers: Google Chrome or Microsoft Edge**  . IF USING DOXIMITY or DOXY.ME - The staff will give you instructions on receiving your link to join the meeting the day of your visit.      2-3 DAYS BEFORE YOUR APPOINTMENT  You will receive a telephone call from one of our [facility] team members - your caller ID may say "Unknown caller." If this is a video visit, we will walk you through how to get the video launched on your phone. We will remind you check your blood pressure, heart rate and weight prior to your scheduled appointment. If you have an Apple Watch or Kardia, please upload any pertinent ECG strips the day before or morning of your appointment to MyChart. Our staff will also make sure you have reviewed the consent and agree to move forward with your scheduled tele-health visit.     THE DAY OF YOUR APPOINTMENT  Approximately 15 minutes prior to your scheduled appointment, you will receive a telephone call from one of Heart[facility] team - your caller ID may say "Unknown caller."    Our staff will confirm medications, vital signs for the day and any symptoms you may be experiencing. Please have this information available prior to the time of visit start. It may also be helpful for you to have a pad of paper and pen handy for any instructions given during your visit. They will also walk you through joining the smartphone meeting if this is a video visit.    CONSENT FOR TELE-HEALTH VISIT - PLEASE REVIEW  I hereby voluntarily request, consent and authorize [facility] and its employed or contracted physicians, physician  assistants, nurse practitioners or other licensed health care professionals (the Practitioner), to provide me with telemedicine health care services (the "Services") as deemed necessary by the treating Practitioner. I acknowledge and consent to receive the Services by the Practitioner via telemedicine. I understand that the telemedicine visit will involve communicating with the Practitioner through live audiovisual communication technology and the disclosure of certain medical information by electronic transmission. I acknowledge that I have been given the opportunity to request an in-person assessment or other available alternative prior to the telemedicine visit and am voluntarily participating in the telemedicine visit.  I understand that I have the right to withhold or withdraw my consent to the use of telemedicine in the course of my care at any time, without affecting my right to future care or treatment, and that the Practitioner or I may terminate the telemedicine visit at any time. I understand that I have the right to inspect all information obtained and/or recorded in the course of the telemedicine visit and may receive copies of available information for a reasonable fee.  I understand that some of the potential risks of receiving the Services via telemedicine include:  . Delay or interruption in medical evaluation due to technological equipment failure or disruption; . Information transmitted may not be sufficient (e.g. poor resolution of images) to allow for appropriate medical decision making by the Practitioner; and/or  . In rare instances, security protocols could fail, causing a breach of personal health information.  Furthermore, I acknowledge that it is my responsibility to provide information about my medical history, conditions and care that is complete and accurate to the best of my ability. I acknowledge that Practitioner's advice, recommendations, and/or decision may be based on  factors not within their control, such as incomplete or inaccurate data provided by me or distortions of diagnostic images or specimens that may result from electronic transmissions. I understand that the practice of medicine is not an exact science and that Practitioner makes no warranties or guarantees regarding treatment outcomes. I acknowledge that I will receive a copy of this consent concurrently upon execution via email to the email address I last provided but may also request a printed copy by calling the office of [facility].    I understand that my insurance will be billed for this visit.   I have read or had this consent read to me. . I understand the contents of this consent, which adequately explains the benefits and risks of the Services being provided via telemedicine.  . I have been provided ample opportunity to ask questions regarding this consent and the Services and have had my questions answered to my satisfaction. . I give my informed consent for the services to be provided through the use of telemedicine in my medical care  By participating in this telemedicine visit I agree to the above.  

## 2018-10-11 ENCOUNTER — Encounter: Payer: Self-pay | Admitting: Cardiology

## 2018-10-11 ENCOUNTER — Telehealth (INDEPENDENT_AMBULATORY_CARE_PROVIDER_SITE_OTHER): Payer: Medicare Other | Admitting: Cardiology

## 2018-10-11 ENCOUNTER — Other Ambulatory Visit: Payer: Self-pay

## 2018-10-11 VITALS — Ht 70.0 in | Wt 190.0 lb

## 2018-10-11 DIAGNOSIS — I2583 Coronary atherosclerosis due to lipid rich plaque: Secondary | ICD-10-CM

## 2018-10-11 DIAGNOSIS — I10 Essential (primary) hypertension: Secondary | ICD-10-CM

## 2018-10-11 DIAGNOSIS — I2510 Atherosclerotic heart disease of native coronary artery without angina pectoris: Secondary | ICD-10-CM

## 2018-10-11 DIAGNOSIS — Z79899 Other long term (current) drug therapy: Secondary | ICD-10-CM

## 2018-10-11 DIAGNOSIS — E7800 Pure hypercholesterolemia, unspecified: Secondary | ICD-10-CM

## 2018-10-11 NOTE — Progress Notes (Signed)
Virtual Visit via Video Note   This visit type was conducted due to national recommendations for restrictions regarding the COVID-19 Pandemic (e.g. social distancing) in an effort to limit this patient's exposure and mitigate transmission in our community.  Due to his co-morbid illnesses, this patient is at least at moderate risk for complications without adequate follow up.  This format is felt to be most appropriate for this patient at this time.  All issues noted in this document were discussed and addressed.  A limited physical exam was performed with this format.  Please refer to the patient's chart for his consent to telehealth for [facility].   Date:  10/11/2018   ID:  Shawn Walton, DOB 05-Nov-1950, MRN [id number]  Patient Location: Home Provider Location: Home  PCP:  Leeroy Cha, MD  Cardiologist:  Candee Furbish, MD  Electrophysiologist:  None   Evaluation Performed:  Follow-Up Visit  Chief Complaint:  CAD follow up  History of Present Illness:    Shawn Walton is a 68 y.o. 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  Also had SVT controlled with carvedilol-however this was stopped due to marked bradycardia, angioedema with ACE inhibitor.  LDL 90 from 60. Increased lipitor.  Lipids were checked previously by his primary care physician, new.  Encouraged continued use of social distancing.  The patient does not have symptoms concerning for COVID-19 infection (fever, chills, cough, or new shortness of breath).    Past Medical History:  Diagnosis Date  . Anxiety   . Bradycardia 08/21/2015  . Coronary artery disease due to lipid rich plaque 08/21/2015   DES RCA 2009   . Depression   . Dysrhythmia    irregular  . Hyperlipidemia 08/21/2015  . MI (myocardial infarction) ([place])   . SVT (supraventricular tachycardia) ([place]) 08/21/2015   Past Surgical History:  Procedure Laterality Date  . COLONOSCOPY WITH PROPOFOL  N/A 11/16/2016   Procedure: COLONOSCOPY WITH PROPOFOL;  Surgeon: Garlan Fair, MD;  Location: [facility];  Service: Endoscopy;  Laterality: N/A;  . CORONARY ANGIOPLASTY    . stent placemnet       Current Meds  Medication Sig  . acetaminophen (TYLENOL) 500 MG tablet Take 500 mg by mouth 2 (two) times daily as needed for mild pain or headache.  . ALPRAZolam (XANAX) 0.5 MG tablet Take 0.25 mg by mouth at bedtime as needed for anxiety.   [name] Kitchen aspirin EC 81 MG tablet Take 81 mg by mouth daily.  [name] Kitchen atorvastatin (LIPITOR) 80 MG tablet Take 1 tablet (80 mg total) by mouth daily.  . citalopram (CELEXA) 20 MG tablet Take 20 mg by mouth daily before breakfast.   . fluticasone (FLONASE) 50 MCG/ACT nasal spray Place 1 spray into both nostrils as needed.  . nitroGLYCERIN (NITROSTAT) 0.4 MG SL tablet Place 1 tablet (0.4 mg total) under the tongue every 5 (five) minutes x 3 doses as needed for chest pain.  [name] Kitchen triamcinolone ointment (KENALOG) 0.1 % Apply 1 application topically 2 (two) times daily.     Allergies:   Erythromycin and Lisinopril   Social History   Tobacco Use  . Smoking status: Never Smoker  . Smokeless tobacco: Never Used  Substance Use Topics  . Alcohol use: Yes    Comment: 1-2 of amy each day   . Drug use: No     Family Hx: The patient's family history includes Stroke in his father.  ROS:   Please  see the history of present illness.    Denies any fevers chills nausea vomiting syncope bleeding All other systems reviewed and are negative.   Prior CV studies:   The following studies were reviewed today:  Prior cardiac catheterization reviewed 2009  Labs/Other Tests and Data Reviewed:    EKG:  An ECG dated 10/17/2017 was personally reviewed today and demonstrated:  Sinus bradycardia 52 with no other abnormalities  Recent Labs: 10/17/2017: ALT 23; BUN 15; Creatinine, Ser 1.06; Potassium 4.4; Sodium 142; TSH 1.670   Recent Lipid Panel Lab Results  Component Value  Date/Time   CHOL 143 10/17/2017 09:47 AM   TRIG 145 10/17/2017 09:47 AM   HDL 54 10/17/2017 09:47 AM   CHOLHDL 2.6 10/17/2017 09:47 AM   CHOLHDL 4.7 08/03/2007 05:50 AM   LDLCALC 60 10/17/2017 09:47 AM    Wt Readings from Last 3 Encounters:  10/11/18 190 lb (86.2 kg)  10/17/17 197 lb (89.4 kg)  11/16/16 185 lb (83.9 kg)     Objective:    Vital Signs:  Ht 5\' 10"  (1.778 m)   Wt 190 lb (86.2 kg)   BMI 27.26 kg/m    VITAL SIGNS:  reviewed GEN:  no acute distress EYES:  sclerae anicteric, EOMI - Extraocular Movements Intact RESPIRATORY:  normal respiratory effort, symmetric expansion CARDIOVASCULAR:  no peripheral edema SKIN:  no rash, lesions or ulcers. MUSCULOSKELETAL:  no obvious deformities. NEURO:  alert and oriented x 3, no obvious focal deficit PSYCH:  normal affect  ASSESSMENT & PLAN:    Coronary artery disease -Prior MI in 2009 with DES to RCA.  Doing well, no anginal symptoms.  Active.  Aggressive secondary risk factor prevention.  Hyperlipidemia -Continue with statin therapy.  LDL 60.  ALT 23.  Previously, LDL then increased to 90.  We have increased his atorvastatin and we will be checking his lipid panel soon.  Essential hypertension - Doing very well, no changes made.  He did note however that he did have an elevated blood pressure reading at the last primary care physician office visit.  Prior bradycardia -Off of carvedilol.  Doing well.  Asymptomatic.  Prior heart rate 52.  COVID-19 Education: The signs and symptoms of COVID-19 were discussed with the patient and how to seek care for testing (follow up with PCP or arrange E-visit).  The importance of social distancing was discussed today.  Time:   Today, I have spent 12 minutes with the patient with telehealth technology discussing the above problems.     Medication Adjustments/Labs and Tests Ordered: Current medicines are reviewed at length with the patient today.  Concerns regarding medicines are  outlined above.   Tests Ordered: Orders Placed This Encounter  Procedures  . ALT  . Lipid panel    Medication Changes: No orders of the defined types were placed in this encounter.   Disposition:  Follow up in 1 year(s)  Signed, Candee Furbish, MD  10/11/2018 1:18 PM    [facility]

## 2018-10-11 NOTE — Patient Instructions (Addendum)
Medication Instructions:  The current medical regimen is effective;  continue present plan and medications.  If you need a refill on your cardiac medications before your next appointment, please call your pharmacy.   Lab work: Please have [place] drawn as scheduled in 12/28/2018.  If you have labs (blood work) drawn today and your tests are completely normal, you will receive your results only by: [name] Kitchen MyChart Message (if you have MyChart) OR . A paper copy in the mail If you have any lab test that is abnormal or we need to change your treatment, we will call you to review the results.  Follow-Up: Follow up in 1 year with Dr. Marlou Porch.  You will receive a letter in the mail 2 months before you are due.  Please call us when you receive this letter to schedule your follow up appointment.  Thank you for choosing [facility]!!

## 2018-10-25 ENCOUNTER — Ambulatory Visit: Payer: BLUE CROSS/BLUE SHIELD | Admitting: Cardiology

## 2018-12-28 ENCOUNTER — Other Ambulatory Visit: Payer: Medicare Other | Admitting: *Deleted

## 2018-12-28 ENCOUNTER — Other Ambulatory Visit: Payer: Self-pay

## 2018-12-28 DIAGNOSIS — Z79899 Other long term (current) drug therapy: Secondary | ICD-10-CM

## 2018-12-28 DIAGNOSIS — E7800 Pure hypercholesterolemia, unspecified: Secondary | ICD-10-CM

## 2018-12-28 LAB — LIPID PANEL
Chol/HDL Ratio: 2.3 ratio (ref 0.0–5.0)
Cholesterol, Total: 130 mg/dL (ref 100–199)
HDL: 56 mg/dL (ref >39)
LDL Calculated: 60 mg/dL (ref 0–99)
Triglycerides: 72 mg/dL (ref 0–149)
VLDL Cholesterol Cal: 14 mg/dL (ref 5–40)

## 2018-12-28 LAB — ALT: ALT: 25 IU/L (ref 0–44)

## 2019-07-19 ENCOUNTER — Other Ambulatory Visit: Payer: Self-pay | Admitting: Cardiology

## 2019-07-28 ENCOUNTER — Ambulatory Visit: Payer: Medicare Other | Attending: Internal Medicine

## 2019-07-28 DIAGNOSIS — Z23 Encounter for immunization: Secondary | ICD-10-CM | POA: Insufficient documentation

## 2019-07-28 NOTE — Progress Notes (Signed)
   Covid-19 Vaccination Clinic  Name:  KAYN GMEREK    MRN: [id number] DOB: [birth year]  07/28/2019  Mr. Goveia was observed post Covid-19 immunization for 30 minutes based on pre-vaccination screening without incidence. He was provided with Vaccine Information Sheet and instruction to access the V-Safe system.   Mr. Hyneman was instructed to call 911 with any severe reactions post vaccine: [name] Kitchen Difficulty breathing  . Swelling of your face and throat  . A fast heartbeat  . A bad rash all over your body  . Dizziness and weakness    Immunizations Administered    Name Date Dose VIS Date Route   Pfizer COVID-19 Vaccine 07/28/2019  1:58 PM 0.3 mL 05/18/2019 Intramuscular   Manufacturer: [place]   Lot: [id number]   [place]: [id number]

## 2019-08-22 ENCOUNTER — Ambulatory Visit: Payer: Medicare Other | Attending: Internal Medicine

## 2019-08-22 DIAGNOSIS — Z23 Encounter for immunization: Secondary | ICD-10-CM

## 2019-08-22 NOTE — Progress Notes (Signed)
   Covid-19 Vaccination Clinic  Name:  Shawn Walton    MRN: [id number] DOB: 02-17-1951  08/22/2019  Shawn Walton was observed post Covid-19 immunization for 30 minutes based on pre-vaccination screening without incident. He was provided with Vaccine Information Sheet and instruction to access the V-Safe system.   Shawn Walton was instructed to call 911 with any severe reactions post vaccine: [name] Kitchen Difficulty breathing  . Swelling of face and throat  . A fast heartbeat  . A bad rash all over body  . Dizziness and weakness   Immunizations Administered    Name Date Dose VIS Date Route   Pfizer COVID-19 Vaccine 08/22/2019 10:05 AM 0.3 mL 05/18/2019 Intramuscular   Manufacturer: [place]   Lot: [id number]   [place]: [id number]

## 2019-10-15 ENCOUNTER — Ambulatory Visit (INDEPENDENT_AMBULATORY_CARE_PROVIDER_SITE_OTHER): Payer: Medicare Other | Admitting: Cardiology

## 2019-10-15 ENCOUNTER — Encounter: Payer: Self-pay | Admitting: Cardiology

## 2019-10-15 ENCOUNTER — Other Ambulatory Visit: Payer: Self-pay

## 2019-10-15 VITALS — BP 120/70 | HR 49 | Ht 70.0 in | Wt 195.8 lb

## 2019-10-15 DIAGNOSIS — I10 Essential (primary) hypertension: Secondary | ICD-10-CM

## 2019-10-15 DIAGNOSIS — I2510 Atherosclerotic heart disease of native coronary artery without angina pectoris: Secondary | ICD-10-CM

## 2019-10-15 DIAGNOSIS — I2583 Coronary atherosclerosis due to lipid rich plaque: Secondary | ICD-10-CM | POA: Diagnosis not present

## 2019-10-15 DIAGNOSIS — E7800 Pure hypercholesterolemia, unspecified: Secondary | ICD-10-CM

## 2019-10-15 NOTE — Progress Notes (Signed)
Cardiology Office Note:    Date:  10/15/2019   ID:  Shawn Walton, DOB 1950/07/30, MRN [id number]  PCP:  Leeroy Cha, MD  Cardiologist:  Candee Furbish, MD  Electrophysiologist:  None   Referring MD: Leeroy Cha,*     History of Present Illness:    Shawn Walton is a 69 y.o. male coronary artery disease status post drug-eluting stent to RCA in February 2009 here for follow-up.   Previously carvedilol was stopped because of bradycardia.  Enjoys tennis, golf.  Has some fatigue and anxiety.  Taking SSRI.  Overall been very pleased with how he is feeling.  Past Medical History:  Diagnosis Date  . Anxiety   . Bradycardia 08/21/2015  . Coronary artery disease due to lipid rich plaque 08/21/2015   DES RCA 2009   . Depression   . Dysrhythmia    irregular  . Hyperlipidemia 08/21/2015  . MI (myocardial infarction) ([place])   . SVT (supraventricular tachycardia) ([place]) 08/21/2015    Past Surgical History:  Procedure Laterality Date  . COLONOSCOPY WITH PROPOFOL N/A 11/16/2016   Procedure: COLONOSCOPY WITH PROPOFOL;  Surgeon: Garlan Fair, MD;  Location: [facility];  Service: Endoscopy;  Laterality: N/A;  . CORONARY ANGIOPLASTY    . stent placemnet      Current Medications: Current Meds  Medication Sig  . acetaminophen (TYLENOL) 500 MG tablet Take 500 mg by mouth 2 (two) times daily as needed for mild pain or headache.  . ALPRAZolam (XANAX) 0.5 MG tablet Take 0.25 mg by mouth at bedtime as needed for anxiety.   [name] Kitchen aspirin EC 81 MG tablet Take 81 mg by mouth daily.  [name] Kitchen atorvastatin (LIPITOR) 80 MG tablet TAKE 1 TABLET DAILY  . citalopram (CELEXA) 20 MG tablet Take 20 mg by mouth daily before breakfast.   . fluticasone (FLONASE) 50 MCG/ACT nasal spray Place 1 spray into both nostrils as needed.  . nitroGLYCERIN (NITROSTAT) 0.4 MG SL tablet Place 1 tablet (0.4 mg total) under the tongue every 5 (five) minutes x 3 doses as needed for chest pain.  [name] Kitchen  triamcinolone ointment (KENALOG) 0.1 % Apply 1 application topically 2 (two) times daily.     Allergies:   Erythromycin and Lisinopril   Social History   Socioeconomic History  . Marital status: Married    Spouse name: Not on file  . Number of children: Not on file  . Years of education: Not on file  . Highest education level: Not on file  Occupational History  . Not on file  Tobacco Use  . Smoking status: Never Smoker  . Smokeless tobacco: Never Used  Substance and Sexual Activity  . Alcohol use: Yes    Comment: 1-2 of amy each day   . Drug use: No  . Sexual activity: Not on file  Other Topics Concern  . Not on file  Social History Narrative  . Not on file   Social Determinants of Health   Financial Resource Strain:   . Difficulty of Paying Living Expenses:   Food Insecurity:   . Worried About Charity fundraiser in the Last Year:   . Arboriculturist in the Last Year:   Transportation Needs:   . Film/video editor (Medical):   [name] Kitchen Lack of Transportation (Non-Medical):   Physical Activity:   . Days of Exercise per Week:   . Minutes of Exercise per Session:   Stress:   . Feeling of Stress :   Social Connections:   .  Frequency of Communication with Friends and Family:   . Frequency of Social Gatherings with Friends and Family:   . Attends Religious Services:   . Active Member of Clubs or Organizations:   . Attends Archivist Meetings:   [name] Kitchen Marital Status:      Family History: The patient's family history includes Stroke in his father.  ROS:   Please see the history of present illness.    No fevers chills nausea vomiting syncope bleeding all other systems reviewed and are negative.  EKGs/Labs/Other Studies Reviewed:    The following studies were reviewed today: Lab work from outside facility, LDL 60 ALT 25 creatinine normal  EKG:  EKG is  ordered today.  The ekg ordered today demonstrates sinus bradycardia 49 with no other significant  abnormalities.  Recent Labs: 12/28/2018: ALT 25  Recent Lipid Panel    Component Value Date/Time   CHOL 130 12/28/2018 0759   TRIG 72 12/28/2018 0759   HDL 56 12/28/2018 0759   CHOLHDL 2.3 12/28/2018 0759   CHOLHDL 4.7 08/03/2007 0550   VLDL 19 08/03/2007 0550   LDLCALC 60 12/28/2018 0759    Physical Exam:    VS:  BP 120/70   Pulse (!) 49   Ht 5\' 10"  (1.778 m)   Wt 195 lb 12.8 oz (88.8 kg)   SpO2 97%   BMI 28.09 kg/m     Wt Readings from Last 3 Encounters:  10/15/19 195 lb 12.8 oz (88.8 kg)  10/11/18 190 lb (86.2 kg)  10/17/17 197 lb (89.4 kg)     GEN:  Well nourished, well developed in no acute distress HEENT: Normal NECK: No JVD; No carotid bruits LYMPHATICS: No lymphadenopathy CARDIAC: RRR, no murmurs, rubs, gallops RESPIRATORY:  Clear to auscultation without rales, wheezing or rhonchi  ABDOMEN: Soft, non-tender, non-distended MUSCULOSKELETAL:  No edema; No deformity  SKIN: Warm and dry NEUROLOGIC:  Alert and oriented x 3 PSYCHIATRIC:  Normal affect   ASSESSMENT:    1. Coronary artery disease due to lipid rich plaque   2. Essential hypertension   3. Pure hypercholesterolemia    PLAN:    In order of problems listed above:  CAD -DES RCA 2009 no anginal symptoms continue with aggressive secondary risk factor prevention  Hyperlipidemia -Statin therapy goal LDL less than 70.  In July 2020 LDL was 60.  SVT -No longer takes carvedilol because of secondary bradycardia.  Does not seem to be an issue anymore.  Essential hypertension -Has been fairly well controlled.  Doing well.  Fatigue/anxiety -On Lexapro.  Doing well.   Medication Adjustments/Labs and Tests Ordered: Current medicines are reviewed at length with the patient today.  Concerns regarding medicines are outlined above.  No orders of the defined types were placed in this encounter.  No orders of the defined types were placed in this encounter.   Patient Instructions  Medication  Instructions:  The current medical regimen is effective;  continue present plan and medications. *If you need a refill on your cardiac medications before your next appointment, please call your pharmacy*  Follow-Up: At [facility], you and your health needs are our priority.  As part of our continuing mission to provide you with exceptional heart care, we have created designated Provider Care Teams.  These Care Teams include your primary Cardiologist (physician) and Advanced Practice Providers (APPs -  Physician Assistants and Nurse Practitioners) who all work together to provide you with the care you need, when you need it.  We  recommend signing up for the patient portal called "MyChart".  Sign up information is provided on this After Visit Summary.  MyChart is used to connect with patients for Virtual Visits (Telemedicine).  Patients are able to view lab/test results, encounter notes, upcoming appointments, etc.  Non-urgent messages can be sent to your provider as well.   To learn more about what you can do with MyChart, go to [email or web address].    Your next appointment:   12 month(s)  The format for your next appointment:   In Person  Provider:   Candee Furbish, MD  Thank you for choosing [facility]!!        Signed, Candee Furbish, MD  10/15/2019 9:01 AM    [place]

## 2019-10-15 NOTE — Patient Instructions (Signed)
Medication Instructions:  The current medical regimen is effective;  continue present plan and medications.  *If you need a refill on your cardiac medications before your next appointment, please call your pharmacy*  Follow-Up: At [facility], you and your health needs are our priority.  As part of our continuing mission to provide you with exceptional heart care, we have created designated Provider Care Teams.  These Care Teams include your primary Cardiologist (physician) and Advanced Practice Providers (APPs -  Physician Assistants and Nurse Practitioners) who all work together to provide you with the care you need, when you need it.  We recommend signing up for the patient portal called "MyChart".  Sign up information is provided on this After Visit Summary.  MyChart is used to connect with patients for Virtual Visits (Telemedicine).  Patients are able to view lab/test results, encounter notes, upcoming appointments, etc.  Non-urgent messages can be sent to your provider as well.   To learn more about what you can do with MyChart, go to [email or web address].    Your next appointment:   12 month(s)  The format for your next appointment:   In Person  Provider:   Mark Skains, MD   Thank you for choosing [facility]!!      

## 2019-10-17 ENCOUNTER — Other Ambulatory Visit: Payer: Self-pay | Admitting: Cardiology

## 2019-10-19 NOTE — Addendum Note (Signed)
Addended by: Maren Beach, Soul Deveney A on: 10/19/2019 07:48 AM   Modules accepted: Orders

## 2020-11-27 ENCOUNTER — Other Ambulatory Visit: Payer: Self-pay | Admitting: Cardiology

## 2020-12-19 ENCOUNTER — Encounter: Payer: Self-pay | Admitting: Cardiology

## 2020-12-19 ENCOUNTER — Ambulatory Visit (INDEPENDENT_AMBULATORY_CARE_PROVIDER_SITE_OTHER): Payer: Medicare Other | Admitting: Cardiology

## 2020-12-19 ENCOUNTER — Other Ambulatory Visit: Payer: Self-pay

## 2020-12-19 VITALS — BP 140/70 | HR 52 | Ht 70.0 in | Wt 196.0 lb

## 2020-12-19 DIAGNOSIS — I2510 Atherosclerotic heart disease of native coronary artery without angina pectoris: Secondary | ICD-10-CM

## 2020-12-19 DIAGNOSIS — I471 Supraventricular tachycardia: Secondary | ICD-10-CM | POA: Diagnosis not present

## 2020-12-19 DIAGNOSIS — I2583 Coronary atherosclerosis due to lipid rich plaque: Secondary | ICD-10-CM | POA: Diagnosis not present

## 2020-12-19 DIAGNOSIS — I10 Essential (primary) hypertension: Secondary | ICD-10-CM | POA: Diagnosis not present

## 2020-12-19 DIAGNOSIS — E7800 Pure hypercholesterolemia, unspecified: Secondary | ICD-10-CM | POA: Diagnosis not present

## 2020-12-19 NOTE — Patient Instructions (Signed)
Medication Instructions:  The current medical regimen is effective;  continue present plan and medications.  *If you need a refill on your cardiac medications before your next appointment, please call your pharmacy*  Follow-Up: At [facility], you and your health needs are our priority.  As part of our continuing mission to provide you with exceptional heart care, we have created designated Provider Care Teams.  These Care Teams include your primary Cardiologist (physician) and Advanced Practice Providers (APPs -  Physician Assistants and Nurse Practitioners) who all work together to provide you with the care you need, when you need it.  We recommend signing up for the patient portal called "MyChart".  Sign up information is provided on this After Visit Summary.  MyChart is used to connect with patients for Virtual Visits (Telemedicine).  Patients are able to view lab/test results, encounter notes, upcoming appointments, etc.  Non-urgent messages can be sent to your provider as well.   To learn more about what you can do with MyChart, go to [email or web address].    Your next appointment:   1 year(s)  The format for your next appointment:   In Person  Provider:   Mark Skains, MD   Thank you for choosing [facility]!!    

## 2020-12-19 NOTE — Progress Notes (Signed)
Cardiology Office Note:    Date:  12/19/2020   ID:  JASSON SIEGMANN, DOB 1950/10/09, MRN [id number]  PCP:  Jefm Petty, MD   [facility] Providers Cardiologist:  Candee Furbish, MD     Referring MD: Leeroy Cha,*    History of Present Illness:    Shawn Walton is a 70 y.o. male here for the follow-up of coronary artery disease.  Cardiac catheterization February 2009-drug-eluting stent to the right coronary artery.  Previously had stopped carvedilol, beta-blocker secondary to bradycardia.  Continues to enjoy tennis, golf.  [place] [employer].  has had fatigue and anxiety at times.  Takes citalopram, Celexa 20 mg daily for medical management.    Overall has been doing quite well.  He did have high blood pressure at the dentist office.  Likely whitecoat.  We discussed.  Denies any fevers chills nausea vomiting syncope bleeding.  Past Medical History:  Diagnosis Date   Anxiety    Bradycardia 08/21/2015   Coronary artery disease due to lipid rich plaque 08/21/2015   DES RCA 2009    Depression    Dysrhythmia    irregular   Hyperlipidemia 08/21/2015   MI (myocardial infarction) [facility])    SVT (supraventricular tachycardia) ([place]) 08/21/2015    Past Surgical History:  Procedure Laterality Date   COLONOSCOPY WITH PROPOFOL N/A 11/16/2016   Procedure: COLONOSCOPY WITH PROPOFOL;  Surgeon: Garlan Fair, MD;  Location: [facility];  Service: Endoscopy;  Laterality: N/A;   CORONARY ANGIOPLASTY     stent placemnet      Current Medications: Current Meds  Medication Sig   acetaminophen (TYLENOL) 500 MG tablet Take 500 mg by mouth 2 (two) times daily as needed for mild pain or headache.   ALPRAZolam (XANAX) 0.5 MG tablet Take 0.25 mg by mouth at bedtime as needed for anxiety.    aspirin EC 81 MG tablet Take 81 mg by mouth daily.   atorvastatin (LIPITOR) 80 MG tablet Take 1 tablet (80 mg total) by mouth daily. Please keep upcoming appt with Dr. Marlou Porch in  July 2022 before anymore refills. Thank you   citalopram (CELEXA) 20 MG tablet Take 20 mg by mouth daily before breakfast.    fluticasone (FLONASE) 50 MCG/ACT nasal spray Place 1 spray into both nostrils as needed.   nitroGLYCERIN (NITROSTAT) 0.4 MG SL tablet Place 1 tablet (0.4 mg total) under the tongue every 5 (five) minutes x 3 doses as needed for chest pain.   triamcinolone ointment (KENALOG) 0.1 % Apply 1 application topically 2 (two) times daily.     Allergies:   Erythromycin and Lisinopril   Social History   Socioeconomic History   Marital status: Married    Spouse name: Not on file   Number of children: Not on file   Years of education: Not on file   Highest education level: Not on file  Occupational History   Not on file  Tobacco Use   Smoking status: Never   Smokeless tobacco: Never  Vaping Use   Vaping Use: Never used  Substance and Sexual Activity   Alcohol use: Yes    Comment: 1-2 of amy each day    Drug use: No   Sexual activity: Not on file  Other Topics Concern   Not on file  Social History Narrative   Not on file   Social Determinants of Health   Financial Resource Strain: Not on file  Food Insecurity: Not on file  Transportation Needs: Not on file  Physical Activity: Not on file  Stress: Not on file  Social Connections: Not on file     Family History: The patient's family history includes Stroke in his father.  ROS:   Please see the history of present illness.     All other systems reviewed and are negative.  EKGs/Labs/Other Studies Reviewed:    The following studies were reviewed today: Carotid study reviewed as below  EKG:  EKG is  ordered today.  The ekg ordered today demonstrates sinus bradycardia 53 with no other abnormalities.  Recent Labs: No results found for requested labs within last 8760 hours.  Recent Lipid Panel    Component Value Date/Time   CHOL 130 12/28/2018 0759   TRIG 72 12/28/2018 0759   HDL 56 12/28/2018 0759    CHOLHDL 2.3 12/28/2018 0759   CHOLHDL 4.7 08/03/2007 0550   VLDL 19 08/03/2007 0550   LDLCALC 60 12/28/2018 0759     Risk Assessment/Calculations:          Physical Exam:    VS:  BP 140/70   Pulse (!) 52   Ht 5\' 10"  (1.778 m)   Wt 196 lb (88.9 kg)   SpO2 98%   BMI 28.12 kg/m     Wt Readings from Last 3 Encounters:  12/19/20 196 lb (88.9 kg)  10/15/19 195 lb 12.8 oz (88.8 kg)  10/11/18 190 lb (86.2 kg)     GEN:  Well nourished, well developed in no acute distress HEENT: Normal NECK: No JVD; No carotid bruits LYMPHATICS: No lymphadenopathy CARDIAC: RRR, no murmurs, rubs, gallops RESPIRATORY:  Clear to auscultation without rales, wheezing or rhonchi  ABDOMEN: Soft, non-tender, non-distended MUSCULOSKELETAL:  No edema; No deformity  SKIN: Warm and dry NEUROLOGIC:  Alert and oriented x 3 PSYCHIATRIC:  Normal affect   ASSESSMENT:    1. Coronary artery disease due to lipid rich plaque   2. Essential hypertension   3. Pure hypercholesterolemia   4. SVT (supraventricular tachycardia) (H[facility])    PLAN:    In order of problems listed above:  Coronary artery disease - Drug-eluting stent placed in 2009.  Currently not having any anginal symptoms.  Continuing with aggressive goal-directed medical therapy which includes aspirin 81 mg, atorvastatin 80 mg daily.  Daily exercise good nutrition. - Unable to tolerate beta-blocker previously secondary to bradycardia  Hyperlipidemia - Continue with high intensity statin medical management with atorvastatin 80 mg once a day.  Refills as needed.  No changes made.  No myalgias.  LDL goal less than 70.  Essential hypertension - Has been well controlled on diet/exercise therapy/lifestyle modification.  He did go to the dentist office and his blood pressure was 95 diastolic prior to a dental implant procedure.  [place] hypertension.  He used to have a blood pressure monitor at home but he had such variability with the readings that it  was making him more anxious.  Continue to monitor.  Supraventricular tachycardia - This has not been an issue in quite some time.  No longer on beta-blocker/carvedilol secondary to bradycardia.  Seems to be doing quite well.  Fatigue/anxiety - On Lexapro SSRI.  Doing well.  Carotid artery plaque - Carotid Dopplers 09/07/2016 showed right and left carotid artery homogenous plaque without any significant calcifications at the carotid bifurcations.  No signs of stenosis.  Reassuring.  Continue with statin, aspirin.      Medication Adjustments/Labs and Tests Ordered: Current medicines are reviewed at length with the patient today.  Concerns regarding medicines are  outlined above.  Orders Placed This Encounter  Procedures   EKG 12-Lead    No orders of the defined types were placed in this encounter.   Patient Instructions  Medication Instructions:  The current medical regimen is effective;  continue present plan and medications.  *If you need a refill on your cardiac medications before your next appointment, please call your pharmacy*  Follow-Up: At [facility], you and your health needs are our priority.  As part of our continuing mission to provide you with exceptional heart care, we have created designated Provider Care Teams.  These Care Teams include your primary Cardiologist (physician) and Advanced Practice Providers (APPs -  Physician Assistants and Nurse Practitioners) who all work together to provide you with the care you need, when you need it.  We recommend signing up for the patient portal called "MyChart".  Sign up information is provided on this After Visit Summary.  MyChart is used to connect with patients for Virtual Visits (Telemedicine).  Patients are able to view lab/test results, encounter notes, upcoming appointments, etc.  Non-urgent messages can be sent to your provider as well.   To learn more about what you can do with MyChart, go to [email or web address].     Your next appointment:   1 year(s)  The format for your next appointment:   In Person  Provider:   Candee Furbish, MD  Thank you for choosing [facility]!!     Signed, Candee Furbish, MD  12/19/2020 8:54 AM    [place]

## 2021-02-25 ENCOUNTER — Other Ambulatory Visit: Payer: Self-pay | Admitting: Cardiology

## 2022-02-13 ENCOUNTER — Other Ambulatory Visit: Payer: Self-pay | Admitting: Cardiology

## 2022-03-15 ENCOUNTER — Other Ambulatory Visit: Payer: Self-pay | Admitting: Cardiology

## 2022-05-06 ENCOUNTER — Ambulatory Visit: Payer: Medicare Other | Attending: Cardiology | Admitting: Cardiology

## 2022-05-06 ENCOUNTER — Encounter: Payer: Self-pay | Admitting: Cardiology

## 2022-05-06 VITALS — BP 130/80 | HR 53 | Ht 70.0 in | Wt 198.0 lb

## 2022-05-06 DIAGNOSIS — I10 Essential (primary) hypertension: Secondary | ICD-10-CM | POA: Insufficient documentation

## 2022-05-06 DIAGNOSIS — I2583 Coronary atherosclerosis due to lipid rich plaque: Secondary | ICD-10-CM | POA: Diagnosis present

## 2022-05-06 DIAGNOSIS — I471 Supraventricular tachycardia, unspecified: Secondary | ICD-10-CM | POA: Diagnosis not present

## 2022-05-06 DIAGNOSIS — I2510 Atherosclerotic heart disease of native coronary artery without angina pectoris: Secondary | ICD-10-CM | POA: Insufficient documentation

## 2022-05-06 DIAGNOSIS — E785 Hyperlipidemia, unspecified: Secondary | ICD-10-CM | POA: Diagnosis not present

## 2022-05-06 NOTE — Patient Instructions (Signed)
Medication Instructions:  Your physician recommends that you continue on your current medications as directed. Please refer to the Current Medication list given to you today.  *If you need a refill on your cardiac medications before your next appointment, please call your pharmacy*   Lab Work: None ordered.  If you have labs (blood work) drawn today and your tests are completely normal, you will receive your results only by: [place] (if you have MyChart) OR A paper copy in the mail If you have any lab test that is abnormal or we need to change your treatment, we will call you to review the results.   Testing/Procedures: None ordered.    Follow-Up: At [facility], you and your health needs are our priority.  As part of our continuing mission to provide you with exceptional heart care, we have created designated Provider Care Teams.  These Care Teams include your primary Cardiologist (physician) and Advanced Practice Providers (APPs -  Physician Assistants and Nurse Practitioners) who all work together to provide you with the care you need, when you need it.  We recommend signing up for the patient portal called "MyChart".  Sign up information is provided on this After Visit Summary.  MyChart is used to connect with patients for Virtual Visits (Telemedicine).  Patients are able to view lab/test results, encounter notes, upcoming appointments, etc.  Non-urgent messages can be sent to your provider as well.   To learn more about what you can do with MyChart, go to [email or web address].    Your next appointment:   12 months with Dr Marlou Porch  Important Information About Sugar

## 2022-05-06 NOTE — Progress Notes (Signed)
Cardiology Office Note:    Date:  05/06/2022   ID:  Shawn Walton, DOB 1951/02/24, MRN [id number]  PCP:  Jefm Petty, MD   [facility] Providers Cardiologist:  Candee Furbish, MD     Referring MD: Jefm Petty, MD    History of Present Illness:    Shawn Walton is a 71 y.o. male here for the follow-up of coronary artery disease.  Cardiac catheterization February 2009-drug-eluting stent to the right coronary artery.  Previously had stopped carvedilol, beta-blocker secondary to bradycardia.  Continues to enjoy tennis, golf.  [place] [employer].  has had fatigue and anxiety at times.  Takes citalopram, Celexa 20 mg daily for medical management.    Overall has been doing quite well.  He did have high blood pressure at the dentist office.  Likely whitecoat.  We discussed.  Denies any fevers chills nausea vomiting syncope bleeding.  Very stable.  We talked about Medicare advantage plans.  Past Medical History:  Diagnosis Date   Anxiety    Bradycardia 08/21/2015   Coronary artery disease due to lipid rich plaque 08/21/2015   DES RCA 2009    Depression    Dysrhythmia    irregular   Hyperlipidemia 08/21/2015   MI (myocardial infarction) ([place])    SVT (supraventricular tachycardia) 08/21/2015    Past Surgical History:  Procedure Laterality Date   COLONOSCOPY WITH PROPOFOL N/A 11/16/2016   Procedure: COLONOSCOPY WITH PROPOFOL;  Surgeon: Garlan Fair, MD;  Location: [facility];  Service: Endoscopy;  Laterality: N/A;   CORONARY ANGIOPLASTY     stent placemnet      Current Medications: Current Meds  Medication Sig   acetaminophen (TYLENOL) 500 MG tablet Take 500 mg by mouth 2 (two) times daily as needed for mild pain or headache.   ALPRAZolam (XANAX) 0.5 MG tablet Take 0.25 mg by mouth at bedtime as needed for anxiety.    aspirin EC 81 MG tablet Take 81 mg by mouth daily.   atorvastatin (LIPITOR) 80 MG tablet Take 1 tablet (80 mg total) by mouth daily.  Keep appt in Nov for future refills.   citalopram (CELEXA) 20 MG tablet Take 20 mg by mouth daily before breakfast.    fluticasone (FLONASE) 50 MCG/ACT nasal spray Place 1 spray into both nostrils as needed.   nitroGLYCERIN (NITROSTAT) 0.4 MG SL tablet Place 1 tablet (0.4 mg total) under the tongue every 5 (five) minutes x 3 doses as needed for chest pain.   triamcinolone ointment (KENALOG) 0.1 % Apply 1 application topically 2 (two) times daily.     Allergies:   Erythromycin and Lisinopril   Social History   Socioeconomic History   Marital status: Married    Spouse name: Not on file   Number of children: Not on file   Years of education: Not on file   Highest education level: Not on file  Occupational History   Not on file  Tobacco Use   Smoking status: Never   Smokeless tobacco: Never  Vaping Use   Vaping Use: Never used  Substance and Sexual Activity   Alcohol use: Yes    Comment: 1-2 of amy each day    Drug use: No   Sexual activity: Not on file  Other Topics Concern   Not on file  Social History Narrative   Not on file   Social Determinants of Health   Financial Resource Strain: Not on file  Food Insecurity: Not on file  Transportation Needs: Not  on file  Physical Activity: Not on file  Stress: Not on file  Social Connections: Not on file     Family History: The patient's family history includes Stroke in his father.  ROS:   Please see the history of present illness.     All other systems reviewed and are negative.  EKGs/Labs/Other Studies Reviewed:    The following studies were reviewed today: Carotid study reviewed as below  EKG:  EKG is  ordered today.  The ekg ordered today demonstrates sinus bradycardia 53 with no other abnormalities.Stable  Recent Labs: No results found for requested labs within last 365 days.  Recent Lipid Panel    Component Value Date/Time   CHOL 130 12/28/2018 0759   TRIG 72 12/28/2018 0759   HDL 56 12/28/2018 0759    CHOLHDL 2.3 12/28/2018 0759   CHOLHDL 4.7 08/03/2007 0550   VLDL 19 08/03/2007 0550   LDLCALC 60 12/28/2018 0759     Risk Assessment/Calculations:          Physical Exam:    VS:  BP 130/80 (BP Location: Left Arm, Patient Position: Sitting, Cuff Size: Normal)   Pulse (!) 53   Ht '5\' 10"'$  (1.778 m)   Wt 198 lb (89.8 kg)   SpO2 98%   BMI 28.41 kg/m     Wt Readings from Last 3 Encounters:  05/06/22 198 lb (89.8 kg)  12/19/20 196 lb (88.9 kg)  10/15/19 195 lb 12.8 oz (88.8 kg)     GEN:  Well nourished, well developed in no acute distress HEENT: Normal NECK: No JVD; No carotid bruits LYMPHATICS: No lymphadenopathy CARDIAC: RRR, no murmurs, rubs, gallops RESPIRATORY:  Clear to auscultation without rales, wheezing or rhonchi  ABDOMEN: Soft, non-tender, non-distended MUSCULOSKELETAL:  No edema; No deformity  SKIN: Warm and dry NEUROLOGIC:  Alert and oriented x 3 PSYCHIATRIC:  Normal affect   ASSESSMENT:    1. Coronary artery disease due to lipid rich plaque   2. Essential hypertension   3. Hyperlipidemia, unspecified hyperlipidemia type   4. SVT (supraventricular tachycardia)     PLAN:    In order of problems listed above:  Coronary artery disease - Drug-eluting stent placed in 2009.  Currently not having any anginal symptoms.  Continuing with aggressive goal-directed medical therapy which includes aspirin 81 mg, atorvastatin 80 mg daily.  Daily exercise good nutrition. - Unable to tolerate beta-blocker previously secondary to bradycardia.  Doing very well.  Exercising well.  Hyperlipidemia - Continue with high intensity statin medical management with atorvastatin 80 mg once a day.  Refills as needed.  No changes made.  No myalgias.  LDL goal less than 70.  Being checked by his PCP.  Excellent.  No problems  Essential hypertension - Has been well controlled on diet/exercise therapy/lifestyle modification.  He did go to the dentist office and his blood pressure was 95  diastolic prior to a dental implant procedure.  Pump Back hypertension.  He used to have a blood pressure monitor at home but he had such variability with the readings that it was making him more anxious.  Continue to monitor.  No recent issues.  Doing well.  Supraventricular tachycardia - This has not been an issue in quite some time.  No longer on beta-blocker/carvedilol secondary to bradycardia.  Seems to be doing quite well.  Fatigue/anxiety - On Citalopram SSRI.  Doing well.  No changes  Carotid artery plaque - Carotid Dopplers 09/07/2016 showed right and left carotid artery homogenous plaque without any  significant calcifications at the carotid bifurcations.  No signs of stenosis.  Reassuring.  Continue with statin, aspirin.      Medication Adjustments/Labs and Tests Ordered: Current medicines are reviewed at length with the patient today.  Concerns regarding medicines are outlined above.  Orders Placed This Encounter  Procedures   EKG 12-Lead    No orders of the defined types were placed in this encounter.    Patient Instructions  Medication Instructions:  Your physician recommends that you continue on your current medications as directed. Please refer to the Current Medication list given to you today.  *If you need a refill on your cardiac medications before your next appointment, please call your pharmacy*   Lab Work: None ordered.  If you have labs (blood work) drawn today and your tests are completely normal, you will receive your results only by: [place] (if you have MyChart) OR A paper copy in the mail If you have any lab test that is abnormal or we need to change your treatment, we will call you to review the results.   Testing/Procedures: None ordered.    Follow-Up: At [facility], you and your health needs are our priority.  As part of our continuing mission to provide you with exceptional heart care, we have created designated Provider  Care Teams.  These Care Teams include your primary Cardiologist (physician) and Advanced Practice Providers (APPs -  Physician Assistants and Nurse Practitioners) who all work together to provide you with the care you need, when you need it.  We recommend signing up for the patient portal called "MyChart".  Sign up information is provided on this After Visit Summary.  MyChart is used to connect with patients for Virtual Visits (Telemedicine).  Patients are able to view lab/test results, encounter notes, upcoming appointments, etc.  Non-urgent messages can be sent to your provider as well.   To learn more about what you can do with MyChart, go to [email or web address].    Your next appointment:   12 months with Dr Marlou Porch  Important Information About Sugar         Signed, Candee Furbish, MD  05/06/2022 9:30 AM    [place]

## 2022-05-14 ENCOUNTER — Other Ambulatory Visit: Payer: Self-pay | Admitting: Cardiology

## 2022-10-08 ENCOUNTER — Encounter: Payer: Self-pay | Admitting: Cardiology

## 2022-10-08 MED ORDER — ATORVASTATIN CALCIUM 80 MG PO TABS: 80.0000 mg | ORAL_TABLET | Freq: Every day | ORAL | 3 refills | Status: DC

## 2023-03-24 ENCOUNTER — Encounter: Payer: Self-pay | Admitting: Cardiology

## 2023-06-15 ENCOUNTER — Ambulatory Visit: Payer: No Typology Code available for payment source | Attending: Cardiology | Admitting: Cardiology

## 2023-06-15 VITALS — BP 122/80 | HR 57 | Ht 69.0 in | Wt 203.2 lb

## 2023-06-15 DIAGNOSIS — I471 Supraventricular tachycardia, unspecified: Secondary | ICD-10-CM

## 2023-06-15 DIAGNOSIS — E785 Hyperlipidemia, unspecified: Secondary | ICD-10-CM

## 2023-06-15 DIAGNOSIS — I10 Essential (primary) hypertension: Secondary | ICD-10-CM | POA: Diagnosis not present

## 2023-06-15 DIAGNOSIS — I2510 Atherosclerotic heart disease of native coronary artery without angina pectoris: Secondary | ICD-10-CM

## 2023-06-15 DIAGNOSIS — I2583 Coronary atherosclerosis due to lipid rich plaque: Secondary | ICD-10-CM

## 2023-06-15 NOTE — Patient Instructions (Signed)
 Medication Instructions:  Your physician recommends that you continue on your current medications as directed. Please refer to the Current Medication list given to you today.  *If you need a refill on your cardiac medications before your next appointment, please call your pharmacy*   Lab Work: NONE  If you have labs (blood work) drawn today and your tests are completely normal, you will receive your results only by: MyChart Message (if you have MyChart) OR A paper copy in the mail If you have any lab test that is abnormal or we need to change your treatment, we will call you to review the results.   Testing/Procedures: Your physician has requested that you have a Vascular Screening.    Follow-Up: At [facility], you and your health needs are our priority.  As part of our continuing mission to provide you with exceptional heart care, we have created designated Provider Care Teams.  These Care Teams include your primary Cardiologist (physician) and Advanced Practice Providers (APPs -  Physician Assistants and Nurse Practitioners) who all work together to provide you with the care you need, when you need it.   Your next appointment:   1 year(s)  Provider:   Oneil Parchment, MD

## 2023-06-15 NOTE — Progress Notes (Signed)
 Cardiology Office Note:  .   Date:  06/15/2023  ID:  Shawn Walton, DOB 02-01-1951, MRN [id number] PCP: Millicent Sharper, MD  [facility] Providers Cardiologist:  Oneil Parchment, MD     History of Present Illness: Shawn Walton is a 73 y.o. male Discussed with the use of AI scribe  History of Present Illness   The patient, a 73 year old with a history of coronary artery disease, presented for a follow-up visit. He had a cardiac catheterization in 2009 with PCI and a drug-eluting stent to the right coronary artery. The patient's carvedilol  was previously discontinued due to bradycardia. He reported occasional fatigue and anxiety, which he attributed to white coat hypertension. The patient is currently on aspirin  81 mg and atorvastatin  80 mg for hyperlipidemia. He reported no chest pain and is generally stable.  In 2018, the patient was found to have minimal carotid artery plaque and has been continuing with statin therapy since then. He also has a remote history of supraventricular tachycardia, but this has not been an issue for quite some time. He is no longer on any beta blocker.  The patient is active, enjoying golf and tennis several times a week. He expressed concern about his carotid arteries after hearing about a friend who had a stroke due to carotid artery blockage. However, he reported no symptoms of stroke or transient ischemic attack. He is interested in a screening test for carotid artery disease.          Studies Reviewed: SABRA   EKG Interpretation Date/Time:  Wednesday June 15 2023 09:32:31 EST Ventricular Rate:  57 PR Interval:  164 QRS Duration:  92 QT Interval:  400 QTC Calculation: 389 R Axis:   28  Text Interpretation: Sinus bradycardia When compared with ECG of 19-Jan-2012 03:31, No significant change was found Confirmed by Parchment Oneil ([id number]) on 06/15/2023 9:54:07 AM    Results   LABS LDL: 60 (2024)  RADIOLOGY Carotid ultrasound: Minimal  plaque (2018)  DIAGNOSTIC Cardiac catheterization: PCI, drug-eluting stent to the right coronary artery (07/2007) EKG: Normal (06/09/2023)     Risk Assessment/Calculations:            Physical Exam:   VS:  BP 122/80 (BP Location: Left Arm)   Pulse (!) 57   Ht 5' 9 (1.753 m)   Wt 203 lb 3.2 oz (92.2 kg)   SpO2 98%   BMI 30.01 kg/m    Wt Readings from Last 3 Encounters:  06/15/23 203 lb 3.2 oz (92.2 kg)  05/06/22 198 lb (89.8 kg)  12/19/20 196 lb (88.9 kg)    GEN: Well nourished, well developed in no acute distress NECK: No JVD; No carotid bruits CARDIAC: RRR, no murmurs, no rubs, no gallops RESPIRATORY:  Clear to auscultation without rales, wheezing or rhonchi  ABDOMEN: Soft, non-tender, non-distended EXTREMITIES:  No edema; No deformity   ASSESSMENT AND PLAN: .    Assessment and Plan    Coronary Artery Disease (CAD) Follow-up for CAD. PCI with drug-eluting stent to the right coronary artery in 2009. Currently asymptomatic with no chest pain or dyspnea. No recent supraventricular tachycardia. Bradycardia led to discontinuation of carvedilol . Continues aspirin  81 mg and atorvastatin  80 mg. EKG normal. Emphasized importance of current medications to prevent cardiac events. Consider extending follow-up to two years if well-managed. - Continue aspirin  81 mg daily - Continue atorvastatin  80 mg daily - Monitor for new symptoms such as chest pain or dyspnea - Consider extending follow-up  to two years if well-managed  Carotid Artery Plaque Minimal carotid artery plaque noted in 2018. Asymptomatic for stroke. LDL controlled at 60 mg/dL. Continues statin and low-dose aspirin . Emphasized maintaining LDL <70 mg/dL to stabilize plaque and reduce stroke risk. Significant carotid narrowing (80-90%) increases stroke risk, but current minimal plaque does not warrant intervention beyond medical therapy. Optional screening for carotid, abdominal aorta, and lower extremity blood flow available  for a fee. - Continue atorvastatin  80 mg daily - Continue aspirin  81 mg daily - Consider carotid Doppler ultrasound screening - Discuss screening options with nurse for carotid, abdominal aorta, and lower extremity blood flow  General Health Maintenance Active, plays tennis three times a week. Emphasized maintaining a healthy lifestyle, including diet and exercise. Encouraged Mediterranean diet and regular physical activity for cardiovascular health. - Encourage Mediterranean diet - Continue regular physical activity - Monitor blood pressure and cholesterol levels regularly  Follow-up - Check screening carotids/abd aorta - Schedule follow-up in one year - Consider extending follow-up to two years if well-managed               Signed, Oneil Parchment, MD

## 2023-06-30 ENCOUNTER — Other Ambulatory Visit: Payer: Self-pay

## 2023-06-30 MED ORDER — ATORVASTATIN CALCIUM 80 MG PO TABS: 80.0000 mg | ORAL_TABLET | Freq: Every day | ORAL | 3 refills | Status: AC

## 2023-07-05 ENCOUNTER — Ambulatory Visit (HOSPITAL_COMMUNITY)
Admission: RE | Admit: 2023-07-05 | Discharge: 2023-07-05 | Disposition: A | Discharge status: Home / Self Care | Payer: No Typology Code available for payment source | Source: Ambulatory Visit | Attending: Cardiology | Admitting: Cardiology

## 2023-07-05 DIAGNOSIS — I471 Supraventricular tachycardia, unspecified: Secondary | ICD-10-CM

## 2023-07-05 DIAGNOSIS — E785 Hyperlipidemia, unspecified: Secondary | ICD-10-CM

## 2023-07-05 DIAGNOSIS — I10 Essential (primary) hypertension: Secondary | ICD-10-CM

## 2023-07-05 DIAGNOSIS — I2510 Atherosclerotic heart disease of native coronary artery without angina pectoris: Secondary | ICD-10-CM

## 2023-07-08 ENCOUNTER — Encounter: Payer: Self-pay | Admitting: Cardiology

## 2023-07-11 ENCOUNTER — Encounter: Payer: Self-pay | Admitting: Cardiology

## 2023-07-11 DIAGNOSIS — Q254 Congenital malformation of aorta unspecified: Secondary | ICD-10-CM

## 2023-08-10 ENCOUNTER — Ambulatory Visit (HOSPITAL_COMMUNITY)
Admission: RE | Admit: 2023-08-10 | Discharge: 2023-08-10 | Disposition: A | Discharge status: Home / Self Care | Payer: No Typology Code available for payment source | Source: Ambulatory Visit | Attending: Cardiology | Admitting: Cardiology

## 2023-08-10 DIAGNOSIS — Q254 Congenital malformation of aorta unspecified: Secondary | ICD-10-CM | POA: Insufficient documentation

## 2023-08-11 ENCOUNTER — Encounter: Payer: Self-pay | Admitting: Cardiology

## 2023-08-11 DIAGNOSIS — I2510 Atherosclerotic heart disease of native coronary artery without angina pectoris: Secondary | ICD-10-CM

## 2023-08-15 NOTE — Telephone Encounter (Signed)
 Please see the MyChart message reply(ies) for my assessment and plan.   Good morning Anant, I think would be reasonable to take the meloxicam 7.5 mg short-term as you described.  In general, we do not like our cardiac patients to be on these types of medications chronically as there has been some evidence that it can slightly increase risk of heart attack.  In general, short-term use seems reasonable.  This patient gave consent for this Medical Advice Message and is aware that it may result in a bill to [employer], as well as the possibility of receiving a bill for a co-payment or deductible. They are an established patient, but are not seeking medical advice exclusively about a problem treated during an in person or video visit in the last seven days. I did not recommend an in person or video visit within seven days of my reply.    I spent a total of 5 minutes cumulative time within 7 days through [employer].  Donato Schultz, MD

## 2024-05-18 ENCOUNTER — Encounter: Payer: Self-pay | Admitting: Cardiology
